# Patient Record
Sex: Female | Born: 1935 | Race: White | Hispanic: No | Smoking: Never smoker
Health system: Southern US, Community
[De-identification: ages and names within clinical notes are randomized; demographics above are authoritative.]

## PROBLEM LIST (undated history)

## (undated) DIAGNOSIS — I10 Essential (primary) hypertension: Secondary | ICD-10-CM

## (undated) DIAGNOSIS — M1990 Unspecified osteoarthritis, unspecified site: Secondary | ICD-10-CM

## (undated) DIAGNOSIS — E119 Type 2 diabetes mellitus without complications: Secondary | ICD-10-CM

## (undated) DIAGNOSIS — I6529 Occlusion and stenosis of unspecified carotid artery: Secondary | ICD-10-CM

## (undated) DIAGNOSIS — E785 Hyperlipidemia, unspecified: Secondary | ICD-10-CM

## (undated) DIAGNOSIS — H9190 Unspecified hearing loss, unspecified ear: Secondary | ICD-10-CM

## (undated) HISTORY — PX: OTHER SURGICAL HISTORY: SHX169

## (undated) HISTORY — PX: EYE SURGERY: SHX253

---

## 2011-12-15 DIAGNOSIS — E119 Type 2 diabetes mellitus without complications: Secondary | ICD-10-CM | POA: Diagnosis not present

## 2011-12-15 DIAGNOSIS — E7800 Pure hypercholesterolemia, unspecified: Secondary | ICD-10-CM | POA: Diagnosis not present

## 2011-12-15 DIAGNOSIS — I10 Essential (primary) hypertension: Secondary | ICD-10-CM | POA: Diagnosis not present

## 2012-02-22 DIAGNOSIS — R238 Other skin changes: Secondary | ICD-10-CM | POA: Diagnosis not present

## 2012-02-22 DIAGNOSIS — R209 Unspecified disturbances of skin sensation: Secondary | ICD-10-CM | POA: Diagnosis not present

## 2012-02-22 DIAGNOSIS — C44319 Basal cell carcinoma of skin of other parts of face: Secondary | ICD-10-CM | POA: Diagnosis not present

## 2012-02-24 DIAGNOSIS — Z1231 Encounter for screening mammogram for malignant neoplasm of breast: Secondary | ICD-10-CM | POA: Diagnosis not present

## 2012-03-27 DIAGNOSIS — I6529 Occlusion and stenosis of unspecified carotid artery: Secondary | ICD-10-CM | POA: Diagnosis not present

## 2012-03-27 DIAGNOSIS — Z48812 Encounter for surgical aftercare following surgery on the circulatory system: Secondary | ICD-10-CM | POA: Diagnosis not present

## 2012-03-30 DIAGNOSIS — M545 Low back pain: Secondary | ICD-10-CM | POA: Diagnosis not present

## 2012-03-30 DIAGNOSIS — M25559 Pain in unspecified hip: Secondary | ICD-10-CM | POA: Diagnosis not present

## 2012-04-18 DIAGNOSIS — C44201 Unspecified malignant neoplasm of skin of unspecified ear and external auricular canal: Secondary | ICD-10-CM | POA: Diagnosis not present

## 2012-04-18 DIAGNOSIS — C44319 Basal cell carcinoma of skin of other parts of face: Secondary | ICD-10-CM | POA: Diagnosis not present

## 2012-04-18 DIAGNOSIS — D485 Neoplasm of uncertain behavior of skin: Secondary | ICD-10-CM | POA: Diagnosis not present

## 2012-05-23 DIAGNOSIS — C44300 Unspecified malignant neoplasm of skin of unspecified part of face: Secondary | ICD-10-CM | POA: Diagnosis not present

## 2012-05-23 DIAGNOSIS — D485 Neoplasm of uncertain behavior of skin: Secondary | ICD-10-CM | POA: Diagnosis not present

## 2012-05-23 DIAGNOSIS — C44319 Basal cell carcinoma of skin of other parts of face: Secondary | ICD-10-CM | POA: Diagnosis not present

## 2012-05-23 DIAGNOSIS — L738 Other specified follicular disorders: Secondary | ICD-10-CM | POA: Diagnosis not present

## 2012-06-12 DIAGNOSIS — I10 Essential (primary) hypertension: Secondary | ICD-10-CM | POA: Diagnosis not present

## 2012-06-12 DIAGNOSIS — E119 Type 2 diabetes mellitus without complications: Secondary | ICD-10-CM | POA: Diagnosis not present

## 2012-06-12 DIAGNOSIS — E7800 Pure hypercholesterolemia, unspecified: Secondary | ICD-10-CM | POA: Diagnosis not present

## 2012-06-25 DIAGNOSIS — E119 Type 2 diabetes mellitus without complications: Secondary | ICD-10-CM | POA: Diagnosis not present

## 2012-08-24 DIAGNOSIS — C44599 Other specified malignant neoplasm of skin of other part of trunk: Secondary | ICD-10-CM | POA: Diagnosis not present

## 2012-09-05 DIAGNOSIS — Z23 Encounter for immunization: Secondary | ICD-10-CM | POA: Diagnosis not present

## 2012-09-13 DIAGNOSIS — C44601 Unspecified malignant neoplasm of skin of unspecified upper limb, including shoulder: Secondary | ICD-10-CM | POA: Diagnosis not present

## 2012-09-13 DIAGNOSIS — C44621 Squamous cell carcinoma of skin of unspecified upper limb, including shoulder: Secondary | ICD-10-CM | POA: Diagnosis not present

## 2012-11-08 DIAGNOSIS — C44201 Unspecified malignant neoplasm of skin of unspecified ear and external auricular canal: Secondary | ICD-10-CM | POA: Diagnosis not present

## 2012-12-13 DIAGNOSIS — I10 Essential (primary) hypertension: Secondary | ICD-10-CM | POA: Diagnosis not present

## 2012-12-13 DIAGNOSIS — E7800 Pure hypercholesterolemia, unspecified: Secondary | ICD-10-CM | POA: Diagnosis not present

## 2012-12-13 DIAGNOSIS — Z13820 Encounter for screening for osteoporosis: Secondary | ICD-10-CM | POA: Diagnosis not present

## 2012-12-13 DIAGNOSIS — E1159 Type 2 diabetes mellitus with other circulatory complications: Secondary | ICD-10-CM | POA: Diagnosis not present

## 2012-12-13 DIAGNOSIS — M2550 Pain in unspecified joint: Secondary | ICD-10-CM | POA: Diagnosis not present

## 2013-01-24 DIAGNOSIS — N951 Menopausal and female climacteric states: Secondary | ICD-10-CM | POA: Diagnosis not present

## 2013-05-27 DIAGNOSIS — D485 Neoplasm of uncertain behavior of skin: Secondary | ICD-10-CM | POA: Diagnosis not present

## 2013-06-12 DIAGNOSIS — E7800 Pure hypercholesterolemia, unspecified: Secondary | ICD-10-CM | POA: Diagnosis not present

## 2013-06-12 DIAGNOSIS — E119 Type 2 diabetes mellitus without complications: Secondary | ICD-10-CM | POA: Diagnosis not present

## 2013-06-12 DIAGNOSIS — I10 Essential (primary) hypertension: Secondary | ICD-10-CM | POA: Diagnosis not present

## 2013-06-27 DIAGNOSIS — L089 Local infection of the skin and subcutaneous tissue, unspecified: Secondary | ICD-10-CM | POA: Diagnosis not present

## 2013-06-27 DIAGNOSIS — E119 Type 2 diabetes mellitus without complications: Secondary | ICD-10-CM | POA: Diagnosis not present

## 2013-06-27 DIAGNOSIS — L578 Other skin changes due to chronic exposure to nonionizing radiation: Secondary | ICD-10-CM | POA: Diagnosis not present

## 2013-06-27 DIAGNOSIS — D485 Neoplasm of uncertain behavior of skin: Secondary | ICD-10-CM | POA: Diagnosis not present

## 2013-07-19 DIAGNOSIS — Z1331 Encounter for screening for depression: Secondary | ICD-10-CM | POA: Diagnosis not present

## 2013-07-19 DIAGNOSIS — M79609 Pain in unspecified limb: Secondary | ICD-10-CM | POA: Diagnosis not present

## 2013-08-20 DIAGNOSIS — M1610 Unilateral primary osteoarthritis, unspecified hip: Secondary | ICD-10-CM | POA: Diagnosis not present

## 2013-08-20 DIAGNOSIS — M5137 Other intervertebral disc degeneration, lumbosacral region: Secondary | ICD-10-CM | POA: Diagnosis not present

## 2013-09-10 DIAGNOSIS — Z23 Encounter for immunization: Secondary | ICD-10-CM | POA: Diagnosis not present

## 2013-10-07 DIAGNOSIS — B0052 Herpesviral keratitis: Secondary | ICD-10-CM | POA: Diagnosis not present

## 2013-10-16 DIAGNOSIS — B0052 Herpesviral keratitis: Secondary | ICD-10-CM | POA: Diagnosis not present

## 2013-10-25 DIAGNOSIS — B0052 Herpesviral keratitis: Secondary | ICD-10-CM | POA: Diagnosis not present

## 2013-11-15 DIAGNOSIS — B0052 Herpesviral keratitis: Secondary | ICD-10-CM | POA: Diagnosis not present

## 2013-12-09 DIAGNOSIS — B0052 Herpesviral keratitis: Secondary | ICD-10-CM | POA: Diagnosis not present

## 2013-12-13 DIAGNOSIS — I10 Essential (primary) hypertension: Secondary | ICD-10-CM | POA: Diagnosis not present

## 2013-12-13 DIAGNOSIS — M169 Osteoarthritis of hip, unspecified: Secondary | ICD-10-CM | POA: Diagnosis not present

## 2013-12-13 DIAGNOSIS — E119 Type 2 diabetes mellitus without complications: Secondary | ICD-10-CM | POA: Diagnosis not present

## 2013-12-13 DIAGNOSIS — E7800 Pure hypercholesterolemia, unspecified: Secondary | ICD-10-CM | POA: Diagnosis not present

## 2013-12-13 DIAGNOSIS — M1610 Unilateral primary osteoarthritis, unspecified hip: Secondary | ICD-10-CM | POA: Diagnosis not present

## 2014-02-10 DIAGNOSIS — B0052 Herpesviral keratitis: Secondary | ICD-10-CM | POA: Diagnosis not present

## 2014-02-14 DIAGNOSIS — I10 Essential (primary) hypertension: Secondary | ICD-10-CM | POA: Diagnosis not present

## 2014-04-03 DIAGNOSIS — M5137 Other intervertebral disc degeneration, lumbosacral region: Secondary | ICD-10-CM | POA: Diagnosis not present

## 2014-04-22 NOTE — Progress Notes (Signed)
[place], [place]  - Please enter preop orders in Epic for [employer] - surg date 6/2 and preop op is 5/28.  Thanks.

## 2014-04-23 ENCOUNTER — Encounter (HOSPITAL_COMMUNITY): Payer: Self-pay | Admitting: Pharmacy Technician

## 2014-04-29 DIAGNOSIS — M1610 Unilateral primary osteoarthritis, unspecified hip: Secondary | ICD-10-CM | POA: Diagnosis not present

## 2014-04-29 DIAGNOSIS — M169 Osteoarthritis of hip, unspecified: Secondary | ICD-10-CM | POA: Diagnosis not present

## 2014-04-29 DIAGNOSIS — Z01818 Encounter for other preprocedural examination: Secondary | ICD-10-CM | POA: Diagnosis not present

## 2014-04-30 ENCOUNTER — Encounter (HOSPITAL_COMMUNITY): Payer: Self-pay

## 2014-05-01 ENCOUNTER — Encounter (INDEPENDENT_AMBULATORY_CARE_PROVIDER_SITE_OTHER): Payer: Self-pay

## 2014-05-01 ENCOUNTER — Encounter (HOSPITAL_COMMUNITY): Payer: Self-pay

## 2014-05-01 ENCOUNTER — Ambulatory Visit (HOSPITAL_COMMUNITY)
Admission: RE | Admit: 2014-05-01 | Discharge: 2014-05-01 | Disposition: A | Discharge status: Home / Self Care | Payer: Medicare Other | Source: Ambulatory Visit | Attending: Anesthesiology | Admitting: Anesthesiology

## 2014-05-01 ENCOUNTER — Encounter (HOSPITAL_COMMUNITY)
Admission: RE | Admit: 2014-05-01 | Discharge: 2014-05-01 | Disposition: A | Discharge status: Home / Self Care | Payer: Medicare Other | Source: Ambulatory Visit | Attending: Orthopedic Surgery | Admitting: Orthopedic Surgery

## 2014-05-01 DIAGNOSIS — Z01812 Encounter for preprocedural laboratory examination: Secondary | ICD-10-CM | POA: Insufficient documentation

## 2014-05-01 DIAGNOSIS — I10 Essential (primary) hypertension: Secondary | ICD-10-CM | POA: Diagnosis not present

## 2014-05-01 DIAGNOSIS — Z01818 Encounter for other preprocedural examination: Secondary | ICD-10-CM | POA: Insufficient documentation

## 2014-05-01 DIAGNOSIS — Z01810 Encounter for preprocedural cardiovascular examination: Secondary | ICD-10-CM | POA: Insufficient documentation

## 2014-05-01 HISTORY — DX: Occlusion and stenosis of unspecified carotid artery: I65.29

## 2014-05-01 HISTORY — DX: Unspecified osteoarthritis, unspecified site: M19.90

## 2014-05-01 HISTORY — DX: Unspecified hearing loss, unspecified ear: H91.90

## 2014-05-01 HISTORY — DX: Type 2 diabetes mellitus without complications: E11.9

## 2014-05-01 HISTORY — DX: Essential (primary) hypertension: I10

## 2014-05-01 HISTORY — DX: Hyperlipidemia, unspecified: E78.5

## 2014-05-01 LAB — URINALYSIS, ROUTINE W REFLEX MICROSCOPIC
Bilirubin Urine: NEGATIVE
GLUCOSE, UA: NEGATIVE mg/dL
HGB URINE DIPSTICK: NEGATIVE
KETONES UR: NEGATIVE mg/dL
Nitrite: NEGATIVE
Protein, ur: NEGATIVE mg/dL
Specific Gravity, Urine: 1.017 (ref 1.005–1.030)
Urobilinogen, UA: 1 mg/dL (ref 0.0–1.0)
pH: 7.5 (ref 5.0–8.0)

## 2014-05-01 LAB — CBC
HCT: 38 % (ref 36.0–46.0)
Hemoglobin: 12.8 g/dL (ref 12.0–15.0)
MCH: 30.1 pg (ref 26.0–34.0)
MCHC: 33.7 g/dL (ref 30.0–36.0)
MCV: 89.4 fL (ref 78.0–100.0)
PLATELETS: 232 10*3/uL (ref 150–400)
RBC: 4.25 MIL/uL (ref 3.87–5.11)
RDW: 12.7 % (ref 11.5–15.5)
WBC: 7.2 10*3/uL (ref 4.0–10.5)

## 2014-05-01 LAB — SURGICAL PCR SCREEN
MRSA, PCR: INVALID — AB
Staphylococcus aureus: INVALID — AB

## 2014-05-01 LAB — ABO/RH: ABO/RH(D): O POS

## 2014-05-01 LAB — BASIC METABOLIC PANEL
BUN: 22 mg/dL (ref 6–23)
CO2: 27 mEq/L (ref 19–32)
Calcium: 10.6 mg/dL — ABNORMAL HIGH (ref 8.4–10.5)
Chloride: 94 mEq/L — ABNORMAL LOW (ref 96–112)
Creatinine, Ser: 0.93 mg/dL (ref 0.50–1.10)
GFR calc non Af Amer: 55 mL/min — ABNORMAL LOW (ref >90)
GFR, EST AFRICAN AMERICAN: 64 mL/min — AB (ref >90)
Glucose, Bld: 117 mg/dL — ABNORMAL HIGH (ref 70–99)
Potassium: 5 mEq/L (ref 3.7–5.3)
Sodium: 133 mEq/L — ABNORMAL LOW (ref 137–147)

## 2014-05-01 LAB — PROTIME-INR
INR: 0.99 (ref 0.00–1.49)
Prothrombin Time: 12.9 seconds (ref 11.6–15.2)

## 2014-05-01 LAB — URINE MICROSCOPIC-ADD ON

## 2014-05-01 LAB — APTT: APTT: 38 s — AB (ref 24–37)

## 2014-05-01 NOTE — H&P (Signed)
TOTAL HIP ADMISSION H&P  Patient is admitted for right total hip arthroplasty, anterior approach.  Subjective:  Chief Complaint:     Right hip OA / pain  HPI: Brandy Hawkins, 80 y.o. female, has a history of pain and functional disability in the right hip(s) due to arthritis and patient has failed non-surgical conservative treatments for greater than 12 weeks to include NSAID's and/or analgesics, use of assistive devices and activity modification.  Onset of symptoms was gradual starting 1+ years ago with gradually worsening course since that time.The patient noted no past surgery on the right hip(s).  Patient currently rates pain in the right hip at 9 out of 10 with activity. Patient has worsening of pain with activity and weight bearing, trendelenberg gait, pain that interfers with activities of daily living and pain with passive range of motion. Patient has evidence of periarticular osteophytes and joint space narrowing by imaging studies. This condition presents safety issues increasing the risk of falls.  There is no current active infection.  Risks, benefits and expectations were discussed with the patient.  Risks including but not limited to the risk of anesthesia, blood clots, nerve damage, blood vessel damage, failure of the prosthesis, infection and up to and including death.  Patient understand the risks, benefits and expectations and wishes to proceed with surgery.   PCP: Beatris Si  D/C Plans:      SNF - [facility] in [place]  Post-op Meds:       No Rx given  Tranexamic Acid:      Is not to be given - CAD  Decadron:      Is not to be given - DM  FYI:     ASA post-op  Norco post-op    Past Medical History  Diagnosis Date  . Diabetes mellitus without complication   . Hypertension   . Hyperlipidemia   . Carotid artery stenosis     SURGERY X 2 LEFT CAROTID--PT STATES NO DISEASE RT CAROTID AND HER LAST VISIT WITH VASCULAR DOCTOR - TOLD ALL OKAY  . Hearing impairment      SOMETIMES WEARS HEARING AIDS  . Arthritis     OA AND PAIN RIGHT HIP    Past Surgical History  Procedure Laterality Date  . Carotid surgery x 2 left side  04/03/02 AND 3/1 04  . Eye surgery      05/30/07 RT EYE  08/01/07  LEFT EYE  . Orif left wrist 09/07/94      No prescriptions prior to admission   No Known Allergies   History  Substance Use Topics  . Smoking status: Never Smoker   . Smokeless tobacco: Not on file  . Alcohol Use: No    No family history on file.   Review of Systems  Constitutional: Negative.   HENT: Positive for hearing loss.   Eyes: Negative.   Respiratory: Negative.   Cardiovascular: Negative.   Gastrointestinal: Negative.   Genitourinary: Negative.   Musculoskeletal: Positive for joint pain.  Skin: Negative.   Neurological: Negative.   Endo/Heme/Allergies: Negative.   Psychiatric/Behavioral: Negative.     Objective:  Physical Exam  Constitutional: She is oriented to person, place, and time. She appears well-developed and well-nourished.  HENT:  Head: Normocephalic and atraumatic.  Mouth/Throat: Oropharynx is clear and moist.  Eyes: Pupils are equal, round, and reactive to light.  Neck: Neck supple. No JVD present. No tracheal deviation present. No thyromegaly present.  Cardiovascular: Normal rate, regular rhythm, normal heart sounds and  intact distal pulses.   Respiratory: Effort normal and breath sounds normal. No stridor. No respiratory distress. She has no wheezes.  GI: Soft. There is no tenderness. There is no guarding.  Musculoskeletal:       Right hip: She exhibits decreased range of motion, decreased strength, tenderness and bony tenderness. She exhibits no swelling, no deformity and no laceration.  Lymphadenopathy:    She has no cervical adenopathy.  Neurological: She is alert and oriented to person, place, and time.  Skin: Skin is warm.  Psychiatric: She has a normal mood and affect.     Imaging Review Plain radiographs  demonstrate severe degenerative joint disease of the right hip(s). The bone quality appears to be good for age and reported activity level.  Assessment/Plan:  End stage arthritis, right hip(s)  The patient history, physical examination, clinical judgement of the provider and imaging studies are consistent with end stage degenerative joint disease of the right hip(s) and total hip arthroplasty is deemed medically necessary. The treatment options including medical management, injection therapy, arthroscopy and arthroplasty were discussed at length. The risks and benefits of total hip arthroplasty were presented and reviewed. The risks due to aseptic loosening, infection, stiffness, dislocation/subluxation,  thromboembolic complications and other imponderables were discussed.  The patient acknowledged the explanation, agreed to proceed with the plan and consent was signed. Patient is being admitted for inpatient treatment for surgery, pain control, PT, OT, prophylactic antibiotics, VTE prophylaxis, progressive ambulation and ADL's and discharge planning.The patient is planning to be discharged to skilled nursing facility.     West Pugh Dreshaun Stene   PA-C  05/01/2014, 2:22 PM

## 2014-05-01 NOTE — Pre-Procedure Instructions (Addendum)
EKG AND CXR WERE DONE TODAY - PREOP AT [facility]. MEDICAL CLEARANCE AND OFFICE NOTES 04/29/14 ON PT'S CHART FROM MARK HEPLER, PA WITH [facility].

## 2014-05-01 NOTE — Patient Instructions (Signed)
YOUR SURGERY IS SCHEDULED AT [facility]  ON:   Tuesday  6/2  REPORT TO  SHORT STAY CENTER AT:  5:00 AM   PLEASE COME IN THE [place] ENTRANCE AND FOLLOW SIGNS TO SHORT STAY CENTER.  DO NOT EAT OR DRINK ANYTHING AFTER MIDNIGHT THE NIGHT BEFORE YOUR SURGERY.  YOU MAY BRUSH YOUR TEETH, RINSE OUT YOUR MOUTH--BUT NO WATER, NO FOOD, NO CHEWING GUM, NO MINTS, NO CANDIES, NO CHEWING TOBACCO.  PLEASE TAKE THE FOLLOWING MEDICATIONS THE AM OF YOUR SURGERY WITH A FEW SIPS OF WATER:  NO MEDICATIONS TO TAKE.   IF YOU ARE DIABETIC:  DO NOT TAKE ANY DIABETIC MEDICATIONS THE AM OF YOUR SURGERY.    DO NOT BRING VALUABLES, [place], CREDIT CARDS.  DO NOT WEAR JEWELRY, MAKE-UP, NAIL POLISH AND NO METAL PINS OR CLIPS IN YOUR HAIR. CONTACT LENS, DENTURES / PARTIALS, GLASSES SHOULD NOT BE WORN TO SURGERY AND IN MOST CASES-HEARING AIDS WILL NEED TO BE REMOVED.  BRING YOUR GLASSES CASE, ANY EQUIPMENT NEEDED FOR YOUR CONTACT LENS. FOR PATIENTS ADMITTED TO THE HOSPITAL--CHECK OUT TIME THE DAY OF DISCHARGE IS 11:00 AM.  ALL INPATIENT ROOMS ARE PRIVATE - WITH BATHROOM, TELEPHONE, TELEVISION AND WIFI INTERNET.                                                     PLEASE READ OVER ANY  FACT SHEETS THAT YOU WERE GIVEN: MRSA INFORMATION, BLOOD TRANSFUSION INFORMATION, INCENTIVE SPIROMETER INFORMATION.  PLEASE BE AWARE THAT YOU MAY NEED ADDITIONAL BLOOD DRAWN DAY OF YOUR SURGERY  _______________________________________________________________________   [facility] - Preparing for Surgery Before surgery, you can play an important role.  Because skin is not sterile, your skin needs to be as free of germs as possible.  You can reduce the number of germs on your skin by washing with CHG (chlorahexidine gluconate) soap before surgery.  CHG is an antiseptic cleaner which kills germs and bonds with the skin to continue killing germs even after washing. Please DO NOT use if you have an allergy to CHG or  antibacterial soaps.  If your skin becomes reddened/irritated stop using the CHG and inform your nurse when you arrive at Short Stay. Do not shave (including legs and underarms) for at least 48 hours prior to the first CHG shower.  You may shave your face/neck. Please follow these instructions carefully:  1.  Shower with CHG Soap the night before surgery and the  morning of Surgery.  2.  If you choose to wash your hair, wash your hair first as usual with your  normal  shampoo.  3.  After you shampoo, rinse your hair and body thoroughly to remove the  shampoo.                           4.  Use CHG as you would any other liquid soap.  You can apply chg directly  to the skin and wash                       Gently with a scrungie or clean washcloth.  5.  Apply the CHG Soap to your body ONLY FROM THE NECK DOWN.   Do not use on face/ open  Wound or open sores. Avoid contact with eyes, ears mouth and genitals (private parts).                       Wash face,  Genitals (private parts) with your normal soap.             6.  Wash thoroughly, paying special attention to the area where your surgery  will be performed.  7.  Thoroughly rinse your body with warm water from the neck down.  8.  DO NOT shower/wash with your normal soap after using and rinsing off  the CHG Soap.                9.  Pat yourself dry with a clean towel.            10.  Wear clean pajamas.            11.  Place clean sheets on your bed the night of your first shower and do not  sleep with pets. Day of Surgery : Do not apply any lotions/deodorants the morning of surgery.  Please wear clean clothes to the hospital/surgery center.  FAILURE TO FOLLOW THESE INSTRUCTIONS MAY RESULT IN THE CANCELLATION OF YOUR SURGERY PATIENT SIGNATURE_________________________________  NURSE SIGNATURE__________________________________  ________________________________________________________________________   Brandy Hawkins  An incentive spirometer is a tool that can help keep your lungs clear and active. This tool measures how well you are filling your lungs with each breath. Taking long deep breaths may help reverse or decrease the chance of developing breathing (pulmonary) problems (especially infection) following:  A long period of time when you are unable to move or be active. BEFORE THE PROCEDURE   If the spirometer includes an indicator to show your best effort, your nurse or respiratory therapist will set it to a desired goal.  If possible, sit up straight or lean slightly forward. Try not to slouch.  Hold the incentive spirometer in an upright position. INSTRUCTIONS FOR USE  1. Sit on the edge of your bed if possible, or sit up as far as you can in bed or on a chair. 2. Hold the incentive spirometer in an upright position. 3. Breathe out normally. 4. Place the mouthpiece in your mouth and seal your lips tightly around it. 5. Breathe in slowly and as deeply as possible, raising the piston or the ball toward the top of the column. 6. Hold your breath for 3-5 seconds or for as long as possible. Allow the piston or ball to fall to the bottom of the column. 7. Remove the mouthpiece from your mouth and breathe out normally. 8. Rest for a few seconds and repeat Steps 1 through 7 at least 10 times every 1-2 hours when you are awake. Take your time and take a few normal breaths between deep breaths. 9. The spirometer may include an indicator to show your best effort. Use the indicator as a goal to work toward during each repetition. 10. After each set of 10 deep breaths, practice coughing to be sure your lungs are clear. If you have an incision (the cut made at the time of surgery), support your incision when coughing by placing a pillow or rolled up towels firmly against it. Once you are able to get out of bed, walk around indoors and cough well. You may stop using the incentive spirometer when  instructed by your caregiver.  RISKS AND COMPLICATIONS  Take your time so you do not  get dizzy or light-headed.  If you are in pain, you may need to take or ask for pain medication before doing incentive spirometry. It is harder to take a deep breath if you are having pain. AFTER USE  Rest and breathe slowly and easily.  It can be helpful to keep track of a log of your progress. Your caregiver can provide you with a simple table to help with this. If you are using the spirometer at home, follow these instructions: [place] IF:   You are having difficultly using the spirometer.  You have trouble using the spirometer as often as instructed.  Your pain medication is not giving enough relief while using the spirometer.  You develop fever of 100.5 F (38.1 C) or higher. SEEK IMMEDIATE MEDICAL CARE IF:   You cough up bloody sputum that had not been present before.  You develop fever of 102 F (38.9 C) or greater.  You develop worsening pain at or near the incision site. MAKE SURE YOU:   Understand these instructions.  Will watch your condition.  Will get help right away if you are not doing well or get worse. Document Released: 04/03/2007 Document Revised: 02/13/2012 Document Reviewed: 06/04/2007 ExitCare Patient Information 2014 ExitCare, [place].   ________________________________________________________________________  WHAT IS A BLOOD TRANSFUSION? Blood Transfusion Information  A transfusion is the replacement of blood or some of its parts. Blood is made up of multiple cells which provide different functions.  Red blood cells carry oxygen and are used for blood loss replacement.  White blood cells fight against infection.  Platelets control bleeding.  Plasma helps clot blood.  Other blood products are available for specialized needs, such as hemophilia or other clotting disorders. BEFORE THE TRANSFUSION  Who gives blood for transfusions?   Healthy  volunteers who are fully evaluated to make sure their blood is safe. This is blood bank blood. Transfusion therapy is the safest it has ever been in the practice of medicine. Before blood is taken from a donor, a complete history is taken to make sure that person has no history of diseases nor engages in risky social behavior (examples are intravenous drug use or sexual activity with multiple partners). The donor's travel history is screened to minimize risk of transmitting infections, such as malaria. The donated blood is tested for signs of infectious diseases, such as HIV and hepatitis. The blood is then tested to be sure it is compatible with you in order to minimize the chance of a transfusion reaction. If you or a relative donates blood, this is often done in anticipation of surgery and is not appropriate for emergency situations. It takes many days to process the donated blood. RISKS AND COMPLICATIONS Although transfusion therapy is very safe and saves many lives, the main dangers of transfusion include:   Getting an infectious disease.  Developing a transfusion reaction. This is an allergic reaction to something in the blood you were given. Every precaution is taken to prevent this. The decision to have a blood transfusion has been considered carefully by your caregiver before blood is given. Blood is not given unless the benefits outweigh the risks. AFTER THE TRANSFUSION  Right after receiving a blood transfusion, you will usually feel much better and more energetic. This is especially true if your red blood cells have gotten low (anemic). The transfusion raises the level of the red blood cells which carry oxygen, and this usually causes an energy increase.  The nurse administering the transfusion will  monitor you carefully for complications. HOME CARE INSTRUCTIONS  No special instructions are needed after a transfusion. You may find your energy is better. Speak with your caregiver about any  limitations on activity for underlying diseases you may have. SEEK MEDICAL CARE IF:   Your condition is not improving after your transfusion.  You develop redness or irritation at the intravenous (IV) site. SEEK IMMEDIATE MEDICAL CARE IF:  Any of the following symptoms occur over the next 12 hours:  Shaking chills.  You have a temperature by mouth above 102 F (38.9 C), not controlled by medicine.  Chest, back, or muscle pain.  People around you feel you are not acting correctly or are confused.  Shortness of breath or difficulty breathing.  Dizziness and fainting.  You get a rash or develop hives.  You have a decrease in urine output.  Your urine turns a dark color or changes to pink, red, or brown. Any of the following symptoms occur over the next 10 days:  You have a temperature by mouth above 102 F (38.9 C), not controlled by medicine.  Shortness of breath.  Weakness after normal activity.  The [place] part of the eye turns yellow (jaundice).  You have a decrease in the amount of urine or are urinating less often.  Your urine turns a dark color or changes to pink, red, or brown. Document Released: 11/18/2000 Document Revised: 02/13/2012 Document Reviewed: 07/07/2008 [facility] Patient Information 2014 [place], [place].  _______________________________________________________________________

## 2014-05-03 LAB — MRSA CULTURE

## 2014-05-06 ENCOUNTER — Encounter (HOSPITAL_COMMUNITY)
Admission: RE | Disposition: A | Discharge status: Skilled Nursing Facility | Payer: Self-pay | Source: Ambulatory Visit | Attending: Orthopedic Surgery

## 2014-05-06 ENCOUNTER — Encounter (HOSPITAL_COMMUNITY): Payer: Self-pay | Admitting: *Deleted

## 2014-05-06 ENCOUNTER — Inpatient Hospital Stay (HOSPITAL_COMMUNITY)
Admission: RE | Admit: 2014-05-06 | Discharge: 2014-05-09 | DRG: 470 | Disposition: A | Discharge status: Skilled Nursing Facility | Payer: Medicare Other | Source: Ambulatory Visit | Attending: Orthopedic Surgery | Admitting: Orthopedic Surgery

## 2014-05-06 ENCOUNTER — Inpatient Hospital Stay (HOSPITAL_COMMUNITY): Payer: Medicare Other

## 2014-05-06 ENCOUNTER — Encounter (HOSPITAL_COMMUNITY): Payer: Medicare Other | Admitting: Anesthesiology

## 2014-05-06 ENCOUNTER — Inpatient Hospital Stay (HOSPITAL_COMMUNITY): Payer: Medicare Other | Admitting: Anesthesiology

## 2014-05-06 DIAGNOSIS — E119 Type 2 diabetes mellitus without complications: Secondary | ICD-10-CM | POA: Diagnosis present

## 2014-05-06 DIAGNOSIS — Z96649 Presence of unspecified artificial hip joint: Secondary | ICD-10-CM | POA: Diagnosis not present

## 2014-05-06 DIAGNOSIS — Z7982 Long term (current) use of aspirin: Secondary | ICD-10-CM | POA: Diagnosis not present

## 2014-05-06 DIAGNOSIS — E785 Hyperlipidemia, unspecified: Secondary | ICD-10-CM | POA: Diagnosis present

## 2014-05-06 DIAGNOSIS — D62 Acute posthemorrhagic anemia: Secondary | ICD-10-CM | POA: Diagnosis not present

## 2014-05-06 DIAGNOSIS — M169 Osteoarthritis of hip, unspecified: Principal | ICD-10-CM | POA: Diagnosis present

## 2014-05-06 DIAGNOSIS — H9190 Unspecified hearing loss, unspecified ear: Secondary | ICD-10-CM | POA: Diagnosis present

## 2014-05-06 DIAGNOSIS — M25559 Pain in unspecified hip: Secondary | ICD-10-CM | POA: Diagnosis not present

## 2014-05-06 DIAGNOSIS — R29818 Other symptoms and signs involving the nervous system: Secondary | ICD-10-CM | POA: Diagnosis not present

## 2014-05-06 DIAGNOSIS — Z471 Aftercare following joint replacement surgery: Secondary | ICD-10-CM | POA: Diagnosis not present

## 2014-05-06 DIAGNOSIS — I10 Essential (primary) hypertension: Secondary | ICD-10-CM | POA: Diagnosis not present

## 2014-05-06 DIAGNOSIS — M1610 Unilateral primary osteoarthritis, unspecified hip: Principal | ICD-10-CM | POA: Diagnosis present

## 2014-05-06 DIAGNOSIS — R279 Unspecified lack of coordination: Secondary | ICD-10-CM | POA: Diagnosis not present

## 2014-05-06 DIAGNOSIS — R262 Difficulty in walking, not elsewhere classified: Secondary | ICD-10-CM | POA: Diagnosis not present

## 2014-05-06 DIAGNOSIS — D500 Iron deficiency anemia secondary to blood loss (chronic): Secondary | ICD-10-CM | POA: Diagnosis not present

## 2014-05-06 DIAGNOSIS — Z79899 Other long term (current) drug therapy: Secondary | ICD-10-CM

## 2014-05-06 HISTORY — PX: TOTAL HIP ARTHROPLASTY: SHX124

## 2014-05-06 LAB — GLUCOSE, CAPILLARY
GLUCOSE-CAPILLARY: 110 mg/dL — AB (ref 70–99)
GLUCOSE-CAPILLARY: 176 mg/dL — AB (ref 70–99)
GLUCOSE-CAPILLARY: 151 mg/dL — AB (ref 70–99)
Glucose-Capillary: 112 mg/dL — ABNORMAL HIGH (ref 70–99)

## 2014-05-06 LAB — TYPE AND SCREEN
ABO/RH(D): O POS
Antibody Screen: NEGATIVE

## 2014-05-06 SURGERY — ARTHROPLASTY, HIP, TOTAL, ANTERIOR APPROACH
Anesthesia: Spinal | Site: Hip | Laterality: Right

## 2014-05-06 MED ORDER — PROPOFOL 10 MG/ML IV BOLUS
INTRAVENOUS | Status: AC
  Filled 2014-05-06: qty 20

## 2014-05-06 MED ORDER — BISACODYL 10 MG RE SUPP: 10.0000 mg | Freq: Every day | RECTAL | Status: DC | PRN

## 2014-05-06 MED ORDER — PROPOFOL INFUSION 10 MG/ML OPTIME
INTRAVENOUS | Status: DC | PRN
  Administered 2014-05-06: 75 ug/kg/min via INTRAVENOUS

## 2014-05-06 MED ORDER — INSULIN ASPART 100 UNIT/ML ~~LOC~~ SOLN
0.0000 [IU] | Freq: Three times a day (TID) | SUBCUTANEOUS | Status: DC
  Administered 2014-05-06: 3 [IU] via SUBCUTANEOUS
  Administered 2014-05-07: 2 [IU] via SUBCUTANEOUS

## 2014-05-06 MED ORDER — HYDROMORPHONE HCL PF 1 MG/ML IJ SOLN: 0.5000 mg | INTRAMUSCULAR | Status: DC | PRN

## 2014-05-06 MED ORDER — POLYETHYLENE GLYCOL 3350 17 G PO PACK
17.0000 g | PACK | Freq: Two times a day (BID) | ORAL | Status: DC
  Administered 2014-05-06 – 2014-05-09 (×4): 17 g via ORAL

## 2014-05-06 MED ORDER — FENTANYL CITRATE 0.05 MG/ML IJ SOLN
INTRAMUSCULAR | Status: AC
  Filled 2014-05-06: qty 2

## 2014-05-06 MED ORDER — ONDANSETRON HCL 4 MG PO TABS
4.0000 mg | ORAL_TABLET | Freq: Four times a day (QID) | ORAL | Status: DC | PRN
  Administered 2014-05-07: 4 mg via ORAL
  Filled 2014-05-06: qty 1

## 2014-05-06 MED ORDER — HYDROCODONE-ACETAMINOPHEN 7.5-325 MG PO TABS
1.0000 | ORAL_TABLET | ORAL | Status: DC
  Administered 2014-05-06 – 2014-05-07 (×4): 1 via ORAL
  Administered 2014-05-07: 2 via ORAL
  Administered 2014-05-07 – 2014-05-08 (×2): 1 via ORAL
  Administered 2014-05-08: 2 via ORAL
  Administered 2014-05-08 – 2014-05-09 (×2): 1 via ORAL
  Filled 2014-05-06 (×3): qty 1
  Filled 2014-05-06: qty 2
  Filled 2014-05-06 (×2): qty 1
  Filled 2014-05-06 (×3): qty 2

## 2014-05-06 MED ORDER — SODIUM CHLORIDE 0.9 % IV SOLN
100.0000 mL/h | INTRAVENOUS | Status: DC
  Administered 2014-05-06 (×2): 100 mL/h via INTRAVENOUS
  Filled 2014-05-06 (×14): qty 1000

## 2014-05-06 MED ORDER — FENTANYL CITRATE 0.05 MG/ML IJ SOLN
INTRAMUSCULAR | Status: DC | PRN
  Administered 2014-05-06: 100 ug via INTRAVENOUS

## 2014-05-06 MED ORDER — MENTHOL 3 MG MT LOZG: 1.0000 | LOZENGE | OROMUCOSAL | Status: DC | PRN

## 2014-05-06 MED ORDER — HYDROCHLOROTHIAZIDE 25 MG PO TABS
25.0000 mg | ORAL_TABLET | Freq: Every day | ORAL | Status: DC
  Administered 2014-05-06 – 2014-05-09 (×4): 25 mg via ORAL
  Filled 2014-05-06 (×4): qty 1

## 2014-05-06 MED ORDER — METFORMIN HCL 500 MG PO TABS
500.0000 mg | ORAL_TABLET | Freq: Two times a day (BID) | ORAL | Status: DC
  Administered 2014-05-06 – 2014-05-09 (×6): 500 mg via ORAL
  Filled 2014-05-06 (×8): qty 1

## 2014-05-06 MED ORDER — DOCUSATE SODIUM 100 MG PO CAPS
100.0000 mg | ORAL_CAPSULE | Freq: Two times a day (BID) | ORAL | Status: DC
  Administered 2014-05-06 – 2014-05-09 (×7): 100 mg via ORAL

## 2014-05-06 MED ORDER — METOCLOPRAMIDE HCL 10 MG PO TABS: 5.0000 mg | ORAL_TABLET | Freq: Three times a day (TID) | ORAL | Status: DC | PRN

## 2014-05-06 MED ORDER — 0.9 % SODIUM CHLORIDE (POUR BTL) OPTIME
TOPICAL | Status: DC | PRN
  Administered 2014-05-06: 1000 mL

## 2014-05-06 MED ORDER — ASPIRIN EC 325 MG PO TBEC
325.0000 mg | DELAYED_RELEASE_TABLET | Freq: Two times a day (BID) | ORAL | Status: DC
  Administered 2014-05-07 – 2014-05-09 (×5): 325 mg via ORAL
  Filled 2014-05-06 (×7): qty 1

## 2014-05-06 MED ORDER — FENTANYL CITRATE 0.05 MG/ML IJ SOLN: 25.0000 ug | INTRAMUSCULAR | Status: DC | PRN

## 2014-05-06 MED ORDER — LACTATED RINGERS IV SOLN
INTRAVENOUS | Status: DC | PRN
  Administered 2014-05-06: 09:00:00 via INTRAVENOUS
  Administered 2014-05-06: 1000 mL
  Administered 2014-05-06: 07:00:00 via INTRAVENOUS

## 2014-05-06 MED ORDER — METHOCARBAMOL 1000 MG/10ML IJ SOLN
500.0000 mg | Freq: Four times a day (QID) | INTRAVENOUS | Status: DC | PRN
  Filled 2014-05-06: qty 5

## 2014-05-06 MED ORDER — FERROUS SULFATE 325 (65 FE) MG PO TABS
325.0000 mg | ORAL_TABLET | Freq: Three times a day (TID) | ORAL | Status: DC
  Administered 2014-05-06 – 2014-05-09 (×8): 325 mg via ORAL
  Filled 2014-05-06 (×12): qty 1

## 2014-05-06 MED ORDER — MEPERIDINE HCL 50 MG/ML IJ SOLN: 6.2500 mg | INTRAMUSCULAR | Status: DC | PRN

## 2014-05-06 MED ORDER — DIPHENHYDRAMINE HCL 25 MG PO CAPS: 25.0000 mg | ORAL_CAPSULE | Freq: Four times a day (QID) | ORAL | Status: DC | PRN

## 2014-05-06 MED ORDER — ATORVASTATIN CALCIUM 40 MG PO TABS
40.0000 mg | ORAL_TABLET | Freq: Every day | ORAL | Status: DC
  Administered 2014-05-06 – 2014-05-08 (×3): 40 mg via ORAL
  Filled 2014-05-06 (×4): qty 1

## 2014-05-06 MED ORDER — CEFAZOLIN SODIUM-DEXTROSE 2-3 GM-% IV SOLR
2.0000 g | INTRAVENOUS | Status: AC
  Administered 2014-05-06: 2 g via INTRAVENOUS

## 2014-05-06 MED ORDER — BUPIVACAINE HCL (PF) 0.5 % IJ SOLN
INTRAMUSCULAR | Status: AC
  Filled 2014-05-06: qty 30

## 2014-05-06 MED ORDER — PHENYLEPHRINE HCL 10 MG/ML IJ SOLN
INTRAMUSCULAR | Status: DC | PRN
Start: 2014-05-06 — End: 2014-05-06
  Administered 2014-05-06 (×2): 80 ug via INTRAVENOUS

## 2014-05-06 MED ORDER — EZETIMIBE 10 MG PO TABS
10.0000 mg | ORAL_TABLET | Freq: Every day | ORAL | Status: DC
  Administered 2014-05-06 – 2014-05-08 (×3): 10 mg via ORAL
  Filled 2014-05-06 (×4): qty 1

## 2014-05-06 MED ORDER — ONDANSETRON HCL 4 MG/2ML IJ SOLN: 4.0000 mg | Freq: Four times a day (QID) | INTRAMUSCULAR | Status: DC | PRN

## 2014-05-06 MED ORDER — IRBESARTAN 300 MG PO TABS
300.0000 mg | ORAL_TABLET | Freq: Every day | ORAL | Status: DC
  Administered 2014-05-06 – 2014-05-09 (×4): 300 mg via ORAL
  Filled 2014-05-06 (×4): qty 1

## 2014-05-06 MED ORDER — ACETAMINOPHEN 10 MG/ML IV SOLN
1000.0000 mg | Freq: Once | INTRAVENOUS | Status: AC
  Administered 2014-05-06: 1000 mg via INTRAVENOUS
  Filled 2014-05-06: qty 100

## 2014-05-06 MED ORDER — MIDAZOLAM HCL 5 MG/5ML IJ SOLN
INTRAMUSCULAR | Status: DC | PRN
  Administered 2014-05-06: 0.5 mg via INTRAVENOUS

## 2014-05-06 MED ORDER — CHLORHEXIDINE GLUCONATE 4 % EX LIQD: 60.0000 mL | Freq: Once | CUTANEOUS | Status: DC

## 2014-05-06 MED ORDER — GLYCOPYRROLATE 0.2 MG/ML IJ SOLN
INTRAMUSCULAR | Status: AC
  Filled 2014-05-06: qty 1

## 2014-05-06 MED ORDER — PROPOFOL 10 MG/ML IV BOLUS
INTRAVENOUS | Status: DC | PRN
  Administered 2014-05-06: 50 mg via INTRAVENOUS

## 2014-05-06 MED ORDER — TRANEXAMIC ACID 100 MG/ML IV SOLN
1000.0000 mg | INTRAVENOUS | Status: AC
  Administered 2014-05-06: 1000 mg via INTRAVENOUS
  Filled 2014-05-06: qty 10

## 2014-05-06 MED ORDER — PHENYLEPHRINE HCL 10 MG/ML IJ SOLN
10.0000 mg | INTRAVENOUS | Status: DC | PRN
  Administered 2014-05-06: 10 ug/min via INTRAVENOUS

## 2014-05-06 MED ORDER — MIDAZOLAM HCL 2 MG/2ML IJ SOLN
INTRAMUSCULAR | Status: AC
  Filled 2014-05-06: qty 2

## 2014-05-06 MED ORDER — GLYCOPYRROLATE 0.2 MG/ML IJ SOLN
0.2000 mg | Freq: Once | INTRAMUSCULAR | Status: AC
  Administered 2014-05-06: 0.2 mg via INTRAVENOUS

## 2014-05-06 MED ORDER — METOCLOPRAMIDE HCL 5 MG/ML IJ SOLN: 5.0000 mg | Freq: Three times a day (TID) | INTRAMUSCULAR | Status: DC | PRN

## 2014-05-06 MED ORDER — CELECOXIB 200 MG PO CAPS
200.0000 mg | ORAL_CAPSULE | Freq: Two times a day (BID) | ORAL | Status: DC
  Administered 2014-05-06 – 2014-05-09 (×7): 200 mg via ORAL
  Filled 2014-05-06 (×8): qty 1

## 2014-05-06 MED ORDER — BUPIVACAINE HCL (PF) 0.5 % IJ SOLN
INTRAMUSCULAR | Status: DC | PRN
  Administered 2014-05-06: 3 mL

## 2014-05-06 MED ORDER — PROMETHAZINE HCL 25 MG/ML IJ SOLN: 6.2500 mg | INTRAMUSCULAR | Status: DC | PRN

## 2014-05-06 MED ORDER — OLMESARTAN MEDOXOMIL-HCTZ 40-25 MG PO TABS: 1.0000 | ORAL_TABLET | Freq: Every morning | ORAL | Status: DC

## 2014-05-06 MED ORDER — STERILE WATER FOR IRRIGATION IR SOLN
Status: DC | PRN
  Administered 2014-05-06: 1500 mL

## 2014-05-06 MED ORDER — CEFAZOLIN SODIUM-DEXTROSE 2-3 GM-% IV SOLR
2.0000 g | Freq: Four times a day (QID) | INTRAVENOUS | Status: AC
  Administered 2014-05-06 (×2): 2 g via INTRAVENOUS
  Filled 2014-05-06 (×2): qty 50

## 2014-05-06 MED ORDER — METHOCARBAMOL 500 MG PO TABS: 500.0000 mg | ORAL_TABLET | Freq: Four times a day (QID) | ORAL | Status: DC | PRN

## 2014-05-06 MED ORDER — CEFAZOLIN SODIUM-DEXTROSE 2-3 GM-% IV SOLR
INTRAVENOUS | Status: AC
  Filled 2014-05-06: qty 50

## 2014-05-06 MED ORDER — ALUM & MAG HYDROXIDE-SIMETH 200-200-20 MG/5ML PO SUSP
30.0000 mL | ORAL | Status: DC | PRN
  Administered 2014-05-09: 30 mL via ORAL
  Filled 2014-05-06: qty 30

## 2014-05-06 MED ORDER — DEXAMETHASONE SODIUM PHOSPHATE 10 MG/ML IJ SOLN
INTRAMUSCULAR | Status: DC | PRN
Start: 2014-05-06 — End: 2014-05-06
  Administered 2014-05-06: 10 mg via INTRAVENOUS

## 2014-05-06 MED ORDER — PHENOL 1.4 % MT LIQD: 1.0000 | OROMUCOSAL | Status: DC | PRN

## 2014-05-06 MED ORDER — MAGNESIUM CITRATE PO SOLN: 1.0000 | Freq: Once | ORAL | Status: AC | PRN

## 2014-05-06 MED ORDER — DEXAMETHASONE SODIUM PHOSPHATE 10 MG/ML IJ SOLN
INTRAMUSCULAR | Status: AC
  Filled 2014-05-06: qty 1

## 2014-05-06 SURGICAL SUPPLY — 33 items
BAG ZIPLOCK 12X15 (MISCELLANEOUS) IMPLANT
CAPT HIP PF MOP ×3 IMPLANT
COVER PERINEAL POST (MISCELLANEOUS) ×3 IMPLANT
DERMABOND ADVANCED (GAUZE/BANDAGES/DRESSINGS) ×2
DERMABOND ADVANCED .7 DNX12 (GAUZE/BANDAGES/DRESSINGS) ×1 IMPLANT
DRAPE C-ARM 42X120 X-RAY (DRAPES) ×3 IMPLANT
DRAPE STERI IOBAN 125X83 (DRAPES) ×3 IMPLANT
DRAPE U-SHAPE 47X51 STRL (DRAPES) ×9 IMPLANT
DRSG AQUACEL AG ADV 3.5X10 (GAUZE/BANDAGES/DRESSINGS) ×3 IMPLANT
DURAPREP 26ML APPLICATOR (WOUND CARE) ×3 IMPLANT
ELECT BLADE TIP CTD 4 INCH (ELECTRODE) ×3 IMPLANT
ELECT PENCIL ROCKER SW 15FT (MISCELLANEOUS) ×3 IMPLANT
ELECT REM PT RETURN 15FT ADLT (MISCELLANEOUS) ×3 IMPLANT
FACESHIELD WRAPAROUND (MASK) ×12 IMPLANT
GLOVE BIOGEL PI IND STRL 7.5 (GLOVE) ×1 IMPLANT
GLOVE BIOGEL PI IND STRL 8 (GLOVE) ×1 IMPLANT
GLOVE BIOGEL PI INDICATOR 7.5 (GLOVE) ×2
GLOVE BIOGEL PI INDICATOR 8 (GLOVE) ×2
GLOVE ECLIPSE 8.0 STRL XLNG CF (GLOVE) ×3 IMPLANT
GLOVE ORTHO TXT STRL SZ7.5 (GLOVE) ×6 IMPLANT
GOWN SPEC L3 XXLG W/TWL (GOWN DISPOSABLE) ×3 IMPLANT
GOWN STRL REUS W/TWL LRG LVL3 (GOWN DISPOSABLE) ×3 IMPLANT
KIT BASIN OR (CUSTOM PROCEDURE TRAY) ×3 IMPLANT
PACK TOTAL JOINT (CUSTOM PROCEDURE TRAY) ×3 IMPLANT
SAW OSC TIP CART 19.5X105X1.3 (SAW) ×3 IMPLANT
SUT MNCRL AB 4-0 PS2 18 (SUTURE) ×3 IMPLANT
SUT VIC AB 1 CT1 36 (SUTURE) ×9 IMPLANT
SUT VIC AB 2-0 CT1 27 (SUTURE) ×4
SUT VIC AB 2-0 CT1 TAPERPNT 27 (SUTURE) ×2 IMPLANT
SUT VLOC 180 0 24IN GS25 (SUTURE) ×3 IMPLANT
TOWEL OR 17X26 10 PK STRL BLUE (TOWEL DISPOSABLE) ×3 IMPLANT
TOWEL OR NON WOVEN STRL DISP B (DISPOSABLE) IMPLANT
TRAY FOLEY CATH 14FRSI W/METER (CATHETERS) ×3 IMPLANT

## 2014-05-06 NOTE — Interval H&P Note (Signed)
History and Physical Interval Note:  05/06/2014 7:10 AM  Brandy Hawkins  has presented today for surgery, with the diagnosis of right hip osteoarthritis  The various methods of treatment have been discussed with the patient and family. After consideration of risks, benefits and other options for treatment, the patient has consented to  Procedure(s): RIGHT TOTAL HIP ARTHROPLASTY ANTERIOR APPROACH (Right) as a surgical intervention .  The patient's history has been reviewed, patient examined, no change in status, stable for surgery.  I have reviewed the patient's chart and labs.  Questions were answered to the patient's satisfaction.     [name]

## 2014-05-06 NOTE — Progress Notes (Signed)
Utilization review completed.  

## 2014-05-06 NOTE — Progress Notes (Signed)
Pt's heart rate 30's-40's; Dr. Marcell Barlow notified, order rec'd and med given

## 2014-05-06 NOTE — Anesthesia Preprocedure Evaluation (Addendum)
Anesthesia Evaluation  Patient identified by MRN, date of birth, ID band Patient awake    Reviewed: Allergy & Precautions, H&P , NPO status , Patient's Chart, lab work & pertinent test results  Airway Mallampati: II TM Distance: >3 FB Neck ROM: Full    Dental no notable dental hx.    Pulmonary neg pulmonary ROS,  breath sounds clear to auscultation  Pulmonary exam normal       Cardiovascular hypertension, Pt. on medications Rhythm:Regular Rate:Normal     Neuro/Psych negative neurological ROS  negative psych ROS   GI/Hepatic negative GI ROS, Neg liver ROS,   Endo/Other  diabetes, Type 2, Oral Hypoglycemic Agents  Renal/GU negative Renal ROS  negative genitourinary   Musculoskeletal negative musculoskeletal ROS (+)   Abdominal   Peds negative pediatric ROS (+)  Hematology negative hematology ROS (+)   Anesthesia Other Findings   Reproductive/Obstetrics negative OB ROS                          Anesthesia Physical Anesthesia Plan  ASA: III  Anesthesia Plan: Spinal   Post-op Pain Management:    Induction:   Airway Management Planned: Simple Face Mask  Additional Equipment:   Intra-op Plan:   Post-operative Plan:   Informed Consent: I have reviewed the patients History and Physical, chart, labs and discussed the procedure including the risks, benefits and alternatives for the proposed anesthesia with the patient or authorized representative who has indicated his/her understanding and acceptance.   Dental advisory given  Plan Discussed with: CRNA  Anesthesia Plan Comments:        Anesthesia Quick Evaluation

## 2014-05-06 NOTE — Evaluation (Signed)
Physical Therapy Evaluation Patient Details Name: Brandy Hawkins MRN: [id number] DOB: 21-Apr-1929 Today's Date: 05/06/2014   History of Present Illness  R DA-THA  Clinical Impression  *Pt is s/p THA resulting in the deficits listed below (see PT Problem List).** Pt will benefit from skilled PT to increase their independence and safety with mobility to allow discharge to the venue listed below.    **    Follow Up Recommendations SNF    Equipment Recommendations  None recommended by PT    Recommendations for Other Services       Precautions / Restrictions Precautions Precautions: None Restrictions Weight Bearing Restrictions: No Other Position/Activity Restrictions: WBAT      Mobility  Bed Mobility Overal bed mobility: Needs Assistance Bed Mobility: Supine to Sit     Supine to sit: Mod assist     General bed mobility comments: assist to raise trunk  Transfers Overall transfer level: Needs assistance Equipment used: Rolling walker (2 wheeled) Transfers: Sit to/from Stand Sit to Stand: Min assist         General transfer comment: cues for hand placement, assist to power up  Ambulation/Gait   Ambulation Distance (Feet): 45 Feet Assistive device: Rolling walker (2 wheeled) Gait Pattern/deviations: Step-to pattern   Gait velocity interpretation: Below normal speed for age/gender General Gait Details: cues for positioning in RW (pt steps too far into frame of RW)  Stairs            Wheelchair Mobility    Modified Rankin (Stroke Patients Only)       Balance Overall balance assessment: Needs assistance   Sitting balance-Leahy Scale: Good       Standing balance-Leahy Scale: Poor                               Pertinent Vitals/Pain *5/10 R hip Pain medicine requested Ice applied**    Home Living                        Prior Function                 Hand Dominance        Extremity/Trunk Assessment   Upper  Extremity Assessment: Overall WFL for tasks assessed           Lower Extremity Assessment: RLE deficits/detail RLE Deficits / Details: AAROM R hip WFL, knee ext 3/5, ankle WNL    Cervical / Trunk Assessment: Normal  Communication      Cognition Arousal/Alertness: Awake/alert Behavior During Therapy: WFL for tasks assessed/performed Overall Cognitive Status: Within Functional Limits for tasks assessed                      General Comments      Exercises Total Joint Exercises Ankle Circles/Pumps: AROM;Both;10 reps Heel Slides: AAROM;Right;10 reps;Supine Hip ABduction/ADduction: AAROM;Right;10 reps;Supine      Assessment/Plan    PT Assessment Patient needs continued PT services  PT Diagnosis Difficulty walking;Acute pain   PT Problem List Decreased strength;Decreased activity tolerance;Decreased balance;Pain;Decreased knowledge of use of DME;Decreased mobility;Decreased safety awareness  PT Treatment Interventions DME instruction;Gait training;Functional mobility training;Therapeutic activities;Patient/family education;Therapeutic exercise   PT Goals (Current goals can be found in the Care Plan section) Acute Rehab PT Goals Patient Stated Goal: to walk PT Goal Formulation: With patient Time For Goal Achievement: 05/20/14 Potential to Achieve Goals: Good    Frequency 7X/week  Barriers to discharge        Co-evaluation               End of Session Equipment Utilized During Treatment: Gait belt Activity Tolerance: Patient tolerated treatment well Patient left: in chair;with call bell/phone within reach;with family/visitor present Nurse Communication: Mobility status         Time: 1610-9604 PT Time Calculation (min): 22 min   Charges:   PT Evaluation $Initial PT Evaluation Tier I: 1 Procedure PT Treatments $Gait Training: 8-22 mins   PT G CodesLucile Crater 05/06/2014, 4:09 PM [phone]

## 2014-05-06 NOTE — Plan of Care (Signed)
Problem: Consults Goal: Diagnosis- Total Joint Replacement Right anterior hip     

## 2014-05-06 NOTE — Anesthesia Procedure Notes (Signed)
Spinal  Patient location during procedure: OR End time: 05/06/2014 7:30 AM Staffing CRNA/Resident: Diedra Sinor E Preanesthetic Checklist Completed: patient identified, site marked, surgical consent, pre-op evaluation, timeout performed, IV checked, risks and benefits discussed and monitors and equipment checked Spinal Block Patient position: sitting Prep: Betadine Patient monitoring: continuous pulse ox and blood pressure Approach: right paramedian Location: L2-3 Injection technique: single-shot Needle Needle type: Spinocan  Needle gauge: 22 G Assessment Sensory level: T4 Additional Notes Pt tolerated procedure well. Spinal kit date checked and within date.Good CSF flow without paresthesia or heme.

## 2014-05-06 NOTE — Anesthesia Postprocedure Evaluation (Signed)
  Anesthesia Post-op Note  Patient: Brandy Hawkins  Procedure(s) Performed: Procedure(s) (LRB): RIGHT TOTAL HIP ARTHROPLASTY ANTERIOR APPROACH (Right)  Patient Location: PACU  Anesthesia Type: Spinal  Level of Consciousness: awake and alert   Airway and Oxygen Therapy: Patient Spontanous Breathing  Post-op Pain: mild  Post-op Assessment: Post-op Vital signs reviewed, Patient's Cardiovascular Status Stable, Respiratory Function Stable, Patent Airway and No signs of Nausea or vomiting  Last Vitals:  Filed Vitals:   05/06/14 1247  BP: 187/76  Pulse: 76  Temp: 36.5 C  Resp: 18    Post-op Vital Signs: stable   Complications: No apparent anesthesia complications

## 2014-05-06 NOTE — Transfer of Care (Signed)
Immediate Anesthesia Transfer of Care Note  Patient: Brandy Hawkins  Procedure(s) Performed: Procedure(s): RIGHT TOTAL HIP ARTHROPLASTY ANTERIOR APPROACH (Right)  Patient Location: PACU  Anesthesia Type:Spinal  Level of Consciousness: awake, alert , oriented and patient cooperative   Airway & Oxygen Therapy: Patient Spontanous Breathing and Patient connected to face mask oxygen  Post-op Assessment: Report given to PACU RN and Post -op Vital signs reviewed and stable  Post vital signs: stable  Complications: No apparent anesthesia complications  L1 spinal level

## 2014-05-06 NOTE — Op Note (Signed)
NAME:  Brandy Hawkins                ACCOUNT NO.: [id number]      MEDICAL RECORD NO.: [id number]      FACILITY:  [facility]      PHYSICIAN:  [name]  DATE OF BIRTH:  1929/11/25     DATE OF PROCEDURE:  05/06/2014                                 OPERATIVE REPORT         PREOPERATIVE DIAGNOSIS: Right  hip osteoarthritis.      POSTOPERATIVE DIAGNOSIS:  Right hip osteoarthritis.      PROCEDURE:  Right total hip replacement through an anterior approach   utilizing DePuy THR system, component size 55mm pinnacle cup, a size 32+4 neutral   Altrex liner, a size 4 Hi Tri Lock stem with a 32+1 Articuleze metal head ball.      SURGEON:  Pietro Cassis. Alvan Dame, M.D.      ASSISTANT:  Danae Orleans, PA-C      ANESTHESIA:  Spinal.      SPECIMENS:  None.      COMPLICATIONS:  None.      BLOOD LOSS:  500 cc     DRAINS:  None      INDICATION OF THE PROCEDURE:  Brandy Hawkins is a 80 y.o. female who had   presented to office for evaluation of right hip pain.  Radiographs revealed   progressive degenerative changes with bone-on-bone   articulation to the  hip joint.  The patient had painful limited range of   motion significantly affecting their overall quality of life.  The patient was failing to    respond to conservative measures, and at this point was ready   to proceed with more definitive measures.  The patient has noted progressive   degenerative changes in his hip, progressive problems and dysfunction   with regarding the hip prior to surgery.  Consent was obtained for   benefit of pain relief.  Specific risk of infection, DVT, component   failure, dislocation, need for revision surgery, as well discussion of   the anterior versus posterior approach were reviewed.  Consent was   obtained for benefit of anterior pain relief through an anterior   approach.      PROCEDURE IN DETAIL:  The patient was brought to operative theater.   Once adequate anesthesia, preoperative  antibiotics, 2gm Ancef administered.   The patient was positioned supine on the OSI Hanna table.  Once adequate   padding of boney process was carried out, we had predraped out the hip, and  used fluoroscopy to confirm orientation of the pelvis and position.      The right hip was then prepped and draped from proximal iliac crest to   mid thigh with shower curtain technique.      Time-out was performed identifying the patient, planned procedure, and   extremity.     An incision was then made 2 cm distal and lateral to the   anterior superior iliac spine extending over the orientation of the   tensor fascia lata muscle and sharp dissection was carried down to the   fascia of the muscle and protractor placed in the soft tissues.      The fascia was then incised.  The muscle belly was identified and swept  laterally and retractor placed along the superior neck.  Following   cauterization of the circumflex vessels and removing some pericapsular   fat, a second cobra retractor was placed on the inferior neck.  A third   retractor was placed on the anterior acetabulum after elevating the   anterior rectus.  A L-capsulotomy was along the line of the   superior neck to the trochanteric fossa, then extended proximally and   distally.  Tag sutures were placed and the retractors were then placed   intracapsular.  We then identified the trochanteric fossa and   orientation of my neck cut, confirmed this radiographically   and then made a neck osteotomy with the femur on traction.  The femoral   head was removed without difficulty or complication.  Traction was let   off and retractors were placed posterior and anterior around the   acetabulum.      The labrum and foveal tissue were debrided.  I began reaming with a 42mm   reamer and reamed up to 74mm reamer with good bony bed preparation and a 2mm   cup was chosen.  The final 22mm Pinnacle cup was then impacted under fluoroscopy  to confirm  the depth of penetration and orientation with respect to   abduction.  A screw was placed followed by the hole eliminator.  The final   32+4 neutral Altrex liner was impacted with good visualized rim fit.  The cup was positioned anatomically within the acetabular portion of the pelvis.      At this point, the femur was rolled at 80 degrees.  Further capsule was   released off the inferior aspect of the femoral neck.  I then   released the superior capsule proximally.  The hook was placed laterally   along the femur and elevated manually and held in position with the bed   hook.  The leg was then extended and adducted with the leg rolled to 100   degrees of external rotation.  Once the proximal femur was fully   exposed, I used a box osteotome to set orientation.  I then began   broaching with the starting chili pepper broach and passed this by hand and then broached up to 4.  With the 4 broach in place I chose a high offset neck and did trial reductions.  The offset was appropriate, leg lengths   appeared to be equal, importantly, as compared to her pre-operative radiographs indicating a longer RIGHT leg to begin with, confirmed radiographically.   Given these findings, I went ahead and dislocated the hip, repositioned all   retractors and positioned the right hip in the extended and abducted position.  The final 4 Hi Tri Lock stem was   chosen and it was impacted down to the level of neck cut.  Based on this   and the trial reduction, a 32+1 Articuleze metal ball was chosen and   impacted onto a clean and dry trunnion, and the hip was reduced.  The   hip had been irrigated throughout the case again at this point.  I did   reapproximate the superior capsular leaflet to the anterior leaflet   using #1 Vicryl.  The fascia of the   tensor fascia lata muscle was then reapproximated using #1 Vicryl and #0 V-lock sutures.  The   remaining wound was closed with 2-0 Vicryl and running 4-0 Monocryl.    The hip was cleaned, dried, and dressed sterilely using Dermabond and  Aquacel dressing.  She was then brought   to recovery room in stable condition tolerating the procedure well.    Danae Orleans, PA-C was present for the entirety of the case involved from   preoperative positioning, perioperative retractor management, general   facilitation of the case, as well as primary wound closure as assistant.            Pietro Cassis Alvan Dame, M.D.        05/06/2014 8:49 AM

## 2014-05-07 DIAGNOSIS — D500 Iron deficiency anemia secondary to blood loss (chronic): Secondary | ICD-10-CM | POA: Diagnosis not present

## 2014-05-07 LAB — BASIC METABOLIC PANEL
BUN: 21 mg/dL (ref 6–23)
CO2: 24 mEq/L (ref 19–32)
Calcium: 9.1 mg/dL (ref 8.4–10.5)
Chloride: 97 mEq/L (ref 96–112)
Creatinine, Ser: 0.7 mg/dL (ref 0.50–1.10)
GFR, EST NON AFRICAN AMERICAN: 78 mL/min — AB (ref >90)
Glucose, Bld: 110 mg/dL — ABNORMAL HIGH (ref 70–99)
POTASSIUM: 4.8 meq/L (ref 3.7–5.3)
Sodium: 131 mEq/L — ABNORMAL LOW (ref 137–147)

## 2014-05-07 LAB — GLUCOSE, CAPILLARY
GLUCOSE-CAPILLARY: 110 mg/dL — AB (ref 70–99)
GLUCOSE-CAPILLARY: 130 mg/dL — AB (ref 70–99)
Glucose-Capillary: 246 mg/dL — ABNORMAL HIGH (ref 70–99)
Glucose-Capillary: 129 mg/dL — ABNORMAL HIGH (ref 70–99)
Glucose-Capillary: 102 mg/dL — ABNORMAL HIGH (ref 70–99)

## 2014-05-07 LAB — CBC
HEMATOCRIT: 29 % — AB (ref 36.0–46.0)
Hemoglobin: 9.9 g/dL — ABNORMAL LOW (ref 12.0–15.0)
MCH: 30 pg (ref 26.0–34.0)
MCHC: 34.1 g/dL (ref 30.0–36.0)
MCV: 87.9 fL (ref 78.0–100.0)
PLATELETS: 225 10*3/uL (ref 150–400)
RBC: 3.3 MIL/uL — ABNORMAL LOW (ref 3.87–5.11)
RDW: 12.7 % (ref 11.5–15.5)
WBC: 12.6 10*3/uL — ABNORMAL HIGH (ref 4.0–10.5)

## 2014-05-07 NOTE — Progress Notes (Signed)
05/07/14 1300  PT Visit Information  Last PT Received On 05/07/14  Reason Eval/Treat Not Completed Other (comment) (declined due to nausea)

## 2014-05-07 NOTE — Clinical Social Work Psychosocial (Signed)
Clinical Social Work Department BRIEF PSYCHOSOCIAL ASSESSMENT 05/07/2014  Patient:  [facility]     Account Number:  [id number]     Admit date:  05/06/2014  Clinical Social Worker:  Daiva Huge  Date/Time:  05/07/2014 01:31 PM  Referred by:  Physician  Date Referred:  05/07/2014 Referred for  SNF Placement   Other Referral:   Interview type:  Patient Other interview type:    PSYCHOSOCIAL DATA Living Status:  FAMILY Admitted from facility:   Level of care:   Primary support name:  daughter- Primary support relationship to patient:  FAMILY Degree of support available:   good    CURRENT CONCERNS Current Concerns  Post-Acute Placement   Other Concerns:    SOCIAL WORK ASSESSMENT / PLAN Met with patient who reports she is interested in SNF for rehab- she lives with her daughter who works during the day- she has just completed some therapy and reports being sore and sleepy from not sleeping well last night-  she is interested in [place] which is close to her home.   Assessment/plan status:  Other - See comment Other assessment/ plan:   Information/referral to community resources:   SNF list    PATIENT'S/FAMILY'S RESPONSE TO PLAN OF CARE: Patient appreciaitive of CSW visit and assistance- she is doing well with PT and is hoepful she will not need to be at [facility] for long. CSW will pursue SNF options and advise as bed is secured.   Eduard Clos, MSW, [place]

## 2014-05-07 NOTE — Progress Notes (Signed)
OT Cancellation Note  Patient Details Name: Brandy Hawkins MRN: [id number] DOB: [birth year]   Cancelled Treatment:    Reason Eval/Treat Not Completed: Other (comment) Pt is Medicare/Medicaid and current D/C plan is SNF. No apparent immediate acute care OT needs, therefore will defer OT to SNF. If OT eval is needed please call Acute Rehab Dept. at [place] 05/07/2014, 12:56 PM Lesle Chris, OTR/L [phone] 05/07/2014

## 2014-05-07 NOTE — Clinical Social Work Placement (Signed)
Clinical Social Work Department CLINICAL SOCIAL WORK PLACEMENT NOTE 05/07/2014  Patient:  [facility]  Account Number:  [id number] Admit date:  05/06/2014  Clinical Social Worker:  Daiva Huge  Date/time:  05/07/2014 02:02 PM  Clinical Social Work is seeking post-discharge placement for this patient at the following level of care:   SKILLED NURSING   (*CSW will update this form in Epic as items are completed)   05/07/2014  Patient/family provided with [place] Department of Clinical Social Work's list of facilities offering this level of care within the geographic area requested by the patient (or if unable, by the patient's family).  05/07/2014  Patient/family informed of their freedom to choose among providers that offer the needed level of care, that participate in Medicare, Medicaid or managed care program needed by the patient, have an available bed and are willing to accept the patient.  05/07/2014  Patient/family informed of [facility]' ownership interest in [facility], as well as of the fact that they are under no obligation to receive care at this facility.  PASARR submitted to EDS on 05/07/2014 PASARR number received from EDS on 05/07/2014  FL2 transmitted to all facilities in geographic area requested by pt/family on  05/07/2014 FL2 transmitted to all facilities within larger geographic area on   Patient informed that his/her managed care company has contracts with or will negotiate with  certain facilities, including the following:     Patient/family informed of bed offers received:   Patient chooses bed at  Physician recommends and patient chooses bed at    Patient to be transferred to  on   Patient to be transferred to facility by   The following physician request were entered in Epic:   Additional Comments:   Eduard Clos, MSW, [place]

## 2014-05-07 NOTE — Care Management Note (Signed)
    Page 1 of 1   05/07/2014     11:07:49 AM CARE MANAGEMENT NOTE 05/07/2014  Patient:  [facility]   Account Number:  [id number]  Date Initiated:  05/07/2014  Documentation initiated by:  Sunday Spillers  Subjective/Objective Assessment:   80 yo female admitted s/p Right total hip replacement through an anterior approach. PTA lived at home with daughter.     Action/Plan:   SNF for rehab   Anticipated DC Date:  05/10/2014   Anticipated DC Plan:  SKILLED NURSING FACILITY  In-house referral  Clinical Social Worker      DC Planning Services  CM consult      Choice offered to / List presented to:             Status of service:  Completed, signed off Medicare Important Message given?   (If response is "NO", the following Medicare IM given date fields will be blank) Date Medicare IM given:   Date Additional Medicare IM given:    Discharge Disposition:  [place]  Per UR Regulation:  Reviewed for med. necessity/level of care/duration of stay  If discussed at [place] of Stay Meetings, dates discussed:    Comments:

## 2014-05-07 NOTE — Progress Notes (Addendum)
   Subjective: 1 Day Post-Op Procedure(s) (LRB): RIGHT TOTAL HIP ARTHROPLASTY ANTERIOR APPROACH (Right)   Patient reports pain as mild, pain controlled. No events throughout the night. Denies any CP or SOB. Eventual plan would be to discharged to SNF, [facility].  Objective:   VITALS:   Filed Vitals:   05/07/14  BP: 110/65  Pulse: 75  Temp: 97.2 F (36.2 C)  Resp: 16    Neurovascular intact Dorsiflexion/Plantar flexion intact Incision: dressing C/D/I No cellulitis present Compartment soft  LABS  Recent Labs  05/07/14 0456  HGB 9.9*  HCT 29.0*  WBC 12.6*  PLT 225     Recent Labs  05/07/14 0456  NA 131*  K 4.8  BUN 21  CREATININE 0.70  GLUCOSE 110*     Assessment/Plan: 1 Day Post-Op Procedure(s) (LRB): RIGHT TOTAL HIP ARTHROPLASTY ANTERIOR APPROACH (Right) Foley cath d/c'ed Advance diet Up with therapy D/C IV fluids Discharge to SNF [facility]) eventually, when ready  Expected ABLA  Treated with iron and will observe       West Pugh. Khristian Phillippi   PAC  05/07/2014, 9:59 AM

## 2014-05-07 NOTE — Progress Notes (Signed)
Physical Therapy Treatment Patient Details Name: Brandy Hawkins MRN: [id number] DOB: 15-May-1929 Today's Date: 05-14-14    History of Present Illness R DA-THA    PT Comments    Pt progressing; will benefit from SNF  To allow return to I  Follow Up Recommendations  SNF     Equipment Recommendations  None recommended by PT    Recommendations for Other Services       Precautions / Restrictions Precautions Precautions: None Restrictions Other Position/Activity Restrictions: WBAT    Mobility  Bed Mobility Overal bed mobility: Needs Assistance Bed Mobility: Supine to Sit     Supine to sit: Min guard     General bed mobility comments: for safety, initiate movement  Transfers Overall transfer level: Needs assistance Equipment used: Rolling walker (2 wheeled) Transfers: Sit to/from Stand Sit to Stand: Min assist;Min guard         General transfer comment: cues for hand placement and overall safety awareness  Ambulation/Gait Ambulation/Gait assistance: Min assist;Min guard Ambulation Distance (Feet): 90 Feet (x 2) Assistive device: Rolling walker (2 wheeled) Gait Pattern/deviations: Step-through pattern;Step-to pattern;Antalgic;Decreased stride length     General Gait Details: cues for positioning in RW (pt steps too far into frame of RW)   Stairs            Wheelchair Mobility    Modified Rankin (Stroke Patients Only)       Balance                                    Cognition Arousal/Alertness: Awake/alert Behavior During Therapy: WFL for tasks assessed/performed Overall Cognitive Status: Within Functional Limits for tasks assessed                      Exercises Total Joint Exercises Ankle Circles/Pumps: AROM;Both;10 reps Quad Sets: AROM;Both;10 reps Heel Slides: AAROM;Right;10 reps Hip ABduction/ADduction: AAROM;Right;10 reps    General Comments        Pertinent Vitals/Pain Minimal c/o pain    Home Living                      Prior Function            PT Goals (current goals can now be found in the care plan section) Acute Rehab PT Goals Patient Stated Goal: to walk PT Goal Formulation: With patient Time For Goal Achievement: 05/20/14 Potential to Achieve Goals: Good Progress towards PT goals: Progressing toward goals    Frequency  7X/week    PT Plan Current plan remains appropriate    Co-evaluation             End of Session Equipment Utilized During Treatment: Gait belt Activity Tolerance: Patient tolerated treatment well Patient left: in chair;with call bell/phone within reach;with family/visitor present     Time: 1145-1208 PT Time Calculation (min): 23 min  Charges:  $Gait Training: 8-22 mins $Therapeutic Exercise: 8-22 mins                    G Codes:      Neil Crouch 2014-05-14, 12:13 PM

## 2014-05-08 LAB — CBC
HCT: 26.8 % — ABNORMAL LOW (ref 36.0–46.0)
Hemoglobin: 9 g/dL — ABNORMAL LOW (ref 12.0–15.0)
MCH: 29.9 pg (ref 26.0–34.0)
MCHC: 33.6 g/dL (ref 30.0–36.0)
MCV: 89 fL (ref 78.0–100.0)
Platelets: 196 10*3/uL (ref 150–400)
RBC: 3.01 MIL/uL — ABNORMAL LOW (ref 3.87–5.11)
RDW: 13 % (ref 11.5–15.5)
WBC: 10.3 10*3/uL (ref 4.0–10.5)

## 2014-05-08 LAB — GLUCOSE, CAPILLARY
GLUCOSE-CAPILLARY: 101 mg/dL — AB (ref 70–99)
Glucose-Capillary: 105 mg/dL — ABNORMAL HIGH (ref 70–99)
Glucose-Capillary: 121 mg/dL — ABNORMAL HIGH (ref 70–99)
Glucose-Capillary: 171 mg/dL — ABNORMAL HIGH (ref 70–99)

## 2014-05-08 LAB — BASIC METABOLIC PANEL
BUN: 22 mg/dL (ref 6–23)
CALCIUM: 9 mg/dL (ref 8.4–10.5)
CO2: 25 mEq/L (ref 19–32)
CREATININE: 0.97 mg/dL (ref 0.50–1.10)
Chloride: 95 mEq/L — ABNORMAL LOW (ref 96–112)
GFR calc Af Amer: 61 mL/min — ABNORMAL LOW (ref >90)
GFR, EST NON AFRICAN AMERICAN: 53 mL/min — AB (ref >90)
GLUCOSE: 108 mg/dL — AB (ref 70–99)
Potassium: 4.9 mEq/L (ref 3.7–5.3)
SODIUM: 129 meq/L — AB (ref 137–147)

## 2014-05-08 MED ORDER — HYDROCODONE-ACETAMINOPHEN 7.5-325 MG PO TABS: 1.0000 | ORAL_TABLET | ORAL | Status: DC | PRN

## 2014-05-08 MED ORDER — FERROUS SULFATE 325 (65 FE) MG PO TABS: 325.0000 mg | ORAL_TABLET | Freq: Three times a day (TID) | ORAL | Status: DC

## 2014-05-08 MED ORDER — POLYETHYLENE GLYCOL 3350 17 G PO PACK: 17.0000 g | PACK | Freq: Two times a day (BID) | ORAL | Status: DC

## 2014-05-08 MED ORDER — TIZANIDINE HCL 4 MG PO TABS: 4.0000 mg | ORAL_TABLET | Freq: Four times a day (QID) | ORAL | Status: DC | PRN

## 2014-05-08 MED ORDER — ASPIRIN 325 MG PO TBEC: 325.0000 mg | DELAYED_RELEASE_TABLET | Freq: Two times a day (BID) | ORAL | Status: AC

## 2014-05-08 MED ORDER — DSS 100 MG PO CAPS: 100.0000 mg | ORAL_CAPSULE | Freq: Two times a day (BID) | ORAL | Status: DC

## 2014-05-08 NOTE — Clinical Social Work Note (Signed)
SNF bed confirmed at patient's preference at [facility] for tomorrow- advised PA and SNF and will tentatively plan for tomorrow.  Eduard Clos, MSW, [place]

## 2014-05-08 NOTE — Discharge Summary (Signed)
Physician Discharge Summary  Patient ID: [name] MRN: [id number] DOB/AGE: 78-May-1930 78 y.o.  Admit date: 05/06/2014 Discharge date:  05/09/2014   Procedures:  Procedure(s) (LRB): RIGHT TOTAL HIP ARTHROPLASTY ANTERIOR APPROACH (Right)  Attending Physician:  Dr. Paralee Cancel   Admission Diagnoses:   Right hip OA / pain  Discharge Diagnoses:  Principal Problem:   S/P right THA, AA Active Problems:   Expected blood loss anemia  Past Medical History  Diagnosis Date  . Diabetes mellitus without complication   . Hypertension   . Hyperlipidemia   . Carotid artery stenosis     SURGERY X 2 LEFT CAROTID--PT STATES NO DISEASE RT CAROTID AND HER LAST VISIT WITH VASCULAR DOCTOR - TOLD ALL OKAY  . Hearing impairment     SOMETIMES WEARS HEARING AIDS  . Arthritis     OA AND PAIN RIGHT HIP    HPI: Brandy Hawkins, 80 y.o. female, has a history of pain and functional disability in the right hip(s) due to arthritis and patient has failed non-surgical conservative treatments for greater than 12 weeks to include NSAID's and/or analgesics, use of assistive devices and activity modification. Onset of symptoms was gradual starting 1+ years ago with gradually worsening course since that time.The patient noted no past surgery on the right hip(s). Patient currently rates pain in the right hip at 9 out of 10 with activity. Patient has worsening of pain with activity and weight bearing, trendelenberg gait, pain that interfers with activities of daily living and pain with passive range of motion. Patient has evidence of periarticular osteophytes and joint space narrowing by imaging studies. This condition presents safety issues increasing the risk of falls. There is no current active infection. Risks, benefits and expectations were discussed with the patient. Risks including but not limited to the risk of anesthesia, blood clots, nerve damage, blood vessel damage, failure of the prosthesis, infection and up to and  including death. Patient understand the risks, benefits and expectations and wishes to proceed with surgery.    PCP: Beatris Si   Discharged Condition: good  Hospital Course:  Patient underwent the above stated procedure on 05/06/2014. Patient tolerated the procedure well and brought to the recovery room in good condition and subsequently to the floor.  POD #1 BP: 110/65 ; Pulse: 75 ; Temp: 97.2 F (36.2 C) ; Resp: 16 Patient reports pain as mild, pain controlled. No events throughout the night. Denies any CP or SOB. Eventual plan would be to discharged to SNF, [facility]. Neurovascular intact, dorsiflexion/plantar flexion intact, incision: dressing C/D/I, no cellulitis present and compartment soft.   LABS  Basename    HGB  9.9  HCT  29.0   POD #2  BP: 147/65 ; Pulse: 79 ; Temp: 97.8 F (36.6 C) ; Resp: 16 Patient reports pain as mild, pain controlled. No events throughout the night. Progressing with PT. Still plans on SNF at discharge. Neurovascular intact, dorsiflexion/plantar flexion intact, incision: dressing C/D/I, no cellulitis present and compartment soft.   LABS  Basename    HGB  9.0  HCT  26.8   POD #3  BP: 113/64 ; Pulse: 67 ; Temp: 97.7 F (36.5 C) ; Resp: 16  Patient reports pain as mild, pain controlled. No events throughout the night. Ready to be discharged to skilled nursing facility. Neurovascular intact, dorsiflexion/plantar flexion intact, incision: dressing C/D/I, no cellulitis present and compartment soft.   LABS   No new labs  Discharge Exam: General appearance: alert, cooperative and no  distress Extremities: Homans sign is negative, no sign of DVT, no edema, redness or tenderness in the calves or thighs and no ulcers, gangrene or trophic changes  Disposition:     Skilled nursing facility with follow up in 2 weeks   Follow-up Information   Follow up with [name], MD. Schedule an appointment as soon as possible for a visit in 2  weeks.   Specialty:  Orthopedic Surgery   Contact information:   [address] [place] [address] [phone]       Discharge Instructions   Call MD / Call 911    Complete by:  As directed   If you experience chest pain or shortness of breath, CALL 911 and be transported to the hospital emergency room.  If you develope a fever above 101 F, pus (white drainage) or increased drainage or redness at the wound, or calf pain, call your surgeon's office.     Change dressing    Complete by:  As directed   Maintain surgical dressing for 10-14 days, or until follow up in the clinic.     Constipation Prevention    Complete by:  As directed   Drink plenty of fluids.  Prune juice may be helpful.  You may use a stool softener, such as Colace (over the counter) 100 mg twice a day.  Use MiraLax (over the counter) for constipation as needed.     Diet - low sodium heart healthy    Complete by:  As directed      Discharge instructions    Complete by:  As directed   Maintain surgical dressing for 10-14 days, or until follow up in the clinic. Follow up in 2 weeks at [facility]. Call with any questions or concerns.     Driving restrictions    Complete by:  As directed   No driving for 4 weeks     Increase activity slowly as tolerated    Complete by:  As directed      TED hose    Complete by:  As directed   Use stockings (TED hose) for 2 weeks on both leg(s).  You may remove them at night for sleeping.     Weight bearing as tolerated    Complete by:  As directed              Medication List    STOP taking these medications       ibuprofen 200 MG tablet  Commonly known as:  ADVIL,MOTRIN      TAKE these medications       aspirin 325 MG EC tablet  Take 1 tablet (325 mg total) by mouth 2 (two) times daily.     atorvastatin 40 MG tablet  Commonly known as:  LIPITOR  Take 40 mg by mouth at bedtime.     DSS 100 MG Caps  Take 100 mg by mouth 2 (two) times  daily.     ezetimibe 10 MG tablet  Commonly known as:  ZETIA  Take 10 mg by mouth at bedtime.     ferrous sulfate 325 (65 FE) MG tablet  Take 1 tablet (325 mg total) by mouth 3 (three) times daily after meals.     HYDROcodone-acetaminophen 7.5-325 MG per tablet  Commonly known as:  NORCO  Take 1-2 tablets by mouth every 4 (four) hours as needed for moderate pain.     metFORMIN 500 MG tablet  Commonly known as:  GLUCOPHAGE  Take  500 mg by mouth 2 (two) times daily with a meal.     olmesartan-hydrochlorothiazide 40-25 MG per tablet  Commonly known as:  BENICAR HCT  Take 1 tablet by mouth every morning.     polyethylene glycol packet  Commonly known as:  MIRALAX / GLYCOLAX  Take 17 g by mouth 2 (two) times daily.     tiZANidine 4 MG tablet  Commonly known as:  ZANAFLEX  Take 1 tablet (4 mg total) by mouth every 6 (six) hours as needed for muscle spasms.         Signed: West Pugh. Nabeel Gladson   PA-C  05/08/2014, 8:51 AM

## 2014-05-08 NOTE — Progress Notes (Signed)
Physical Therapy Treatment Patient Details Name: Brandy Hawkins MRN: [id number] DOB: 28-Apr-1929 Today's Date: 2014/06/06    History of Present Illness R DA-THA    PT Comments    Progressing well; will benefit from SNF level therapies to allow return to [facility]   Follow Up Recommendations  SNF     Equipment Recommendations  None recommended by PT    Recommendations for Other Services       Precautions / Restrictions Precautions Precautions: None Restrictions Other Position/Activity Restrictions: WBAT    Mobility  Bed Mobility Overal bed mobility: Needs Assistance Bed Mobility: Supine to Sit     Supine to sit: Supervision     General bed mobility comments: for safety, initiate movement  Transfers Overall transfer level: Needs assistance Equipment used: Rolling walker (2 wheeled) Transfers: Sit to/from Stand Sit to Stand: Min guard;Supervision         General transfer comment: cues for hand placement and overall safety awareness  Ambulation/Gait Ambulation/Gait assistance: Supervision;Min guard Ambulation Distance (Feet): 120 Feet Assistive device: Rolling walker (2 wheeled) Gait Pattern/deviations: Step-to pattern;Step-through pattern Gait velocity: decr   General Gait Details: cues for positioning in RW (pt steps too far into frame of RW)   Stairs            Wheelchair Mobility    Modified Rankin (Stroke Patients Only)       Balance             Standing balance-Leahy Scale: Fair                      Cognition Arousal/Alertness: Awake/alert Behavior During Therapy: WFL for tasks assessed/performed Overall Cognitive Status: Within Functional Limits for tasks assessed                      Exercises Total Joint Exercises Hip ABduction/ADduction: AROM;Strengthening;Right;Standing;10 reps Knee Flexion: AROM;10 reps;Standing;Strengthening Marching in Standing: AROM;Strengthening;Right;10 reps;Standing Standing Hip Extension:  AROM;Strengthening;Right;10 reps;Standing    General Comments        Pertinent Vitals/Pain Pain controlled; "feels better than yesterday"    Home Living                      Prior Function            PT Goals (current goals can now be found in the care plan section) Acute Rehab PT Goals Patient Stated Goal: to walk PT Goal Formulation: With patient Time For Goal Achievement: 05/20/14 Potential to Achieve Goals: Good Progress towards PT goals: Progressing toward goals    Frequency  7X/week    PT Plan Current plan remains appropriate    Co-evaluation             End of Session   Activity Tolerance: Patient tolerated treatment well Patient left: with call bell/phone within reach;in bed     Time: 1305-1330 PT Time Calculation (min): 25 min  Charges:  $Gait Training: 8-22 mins $Therapeutic Exercise: 8-22 mins                    G Codes:      Neil Crouch 06-06-2014, 2:02 PM

## 2014-05-08 NOTE — Progress Notes (Signed)
   Subjective: 2 Days Post-Op Procedure(s) (LRB): RIGHT TOTAL HIP ARTHROPLASTY ANTERIOR APPROACH (Right)   Seen by Dr. Alvan Dame Patient reports pain as mild, pain controlled. No events throughout the night. Progressing with PT. Still plans on SNF at discharge.  Objective:   VITALS:   Filed Vitals:   05/08/14 0535  BP: 147/65  Pulse: 79  Temp: 97.8 F (36.6 C)  Resp: 16    Neurovascular intact Dorsiflexion/Plantar flexion intact Incision: dressing C/D/I No cellulitis present Compartment soft  LABS  Recent Labs  05/07/14 0456 05/08/14 0447  HGB 9.9* 9.0*  HCT 29.0* 26.8*  WBC 12.6* 10.3  PLT 225 196     Recent Labs  05/07/14 0456 05/08/14 0447  NA 131* 129*  K 4.8 4.9  BUN 21 22  CREATININE 0.70 0.97  GLUCOSE 110* 108*     Assessment/Plan: 2 Days Post-Op Procedure(s) (LRB): RIGHT TOTAL HIP ARTHROPLASTY ANTERIOR APPROACH (Right) Up with therapy Discharge to SNF [facility]) eventually, when ready  Expected ABLA  Treated with iron and will observe      [place]. Crystelle Ferrufino   PAC  05/08/2014, 7:42 AM

## 2014-05-09 DIAGNOSIS — I10 Essential (primary) hypertension: Secondary | ICD-10-CM | POA: Diagnosis not present

## 2014-05-09 DIAGNOSIS — E119 Type 2 diabetes mellitus without complications: Secondary | ICD-10-CM | POA: Diagnosis not present

## 2014-05-09 DIAGNOSIS — R279 Unspecified lack of coordination: Secondary | ICD-10-CM | POA: Diagnosis not present

## 2014-05-09 DIAGNOSIS — Z79899 Other long term (current) drug therapy: Secondary | ICD-10-CM | POA: Diagnosis not present

## 2014-05-09 DIAGNOSIS — Z96649 Presence of unspecified artificial hip joint: Secondary | ICD-10-CM | POA: Diagnosis not present

## 2014-05-09 DIAGNOSIS — R29818 Other symptoms and signs involving the nervous system: Secondary | ICD-10-CM | POA: Diagnosis not present

## 2014-05-09 DIAGNOSIS — E785 Hyperlipidemia, unspecified: Secondary | ICD-10-CM | POA: Diagnosis not present

## 2014-05-09 DIAGNOSIS — H9190 Unspecified hearing loss, unspecified ear: Secondary | ICD-10-CM | POA: Diagnosis not present

## 2014-05-09 DIAGNOSIS — Z471 Aftercare following joint replacement surgery: Secondary | ICD-10-CM | POA: Diagnosis not present

## 2014-05-09 DIAGNOSIS — D500 Iron deficiency anemia secondary to blood loss (chronic): Secondary | ICD-10-CM | POA: Diagnosis not present

## 2014-05-09 DIAGNOSIS — R262 Difficulty in walking, not elsewhere classified: Secondary | ICD-10-CM | POA: Diagnosis not present

## 2014-05-09 LAB — GLUCOSE, CAPILLARY
GLUCOSE-CAPILLARY: 118 mg/dL — AB (ref 70–99)
Glucose-Capillary: 122 mg/dL — ABNORMAL HIGH (ref 70–99)

## 2014-05-09 NOTE — Clinical Social Work Placement (Signed)
Clinical Social Work Department CLINICAL SOCIAL WORK PLACEMENT NOTE 05/09/2014  Patient:  [facility]  Account Number:  [id number] Admit date:  05/06/2014  Clinical Social Worker:  Daiva Huge  Date/time:  05/07/2014 02:02 PM  Clinical Social Work is seeking post-discharge placement for this patient at the following level of care:   SKILLED NURSING   (*CSW will update this form in Epic as items are completed)   05/07/2014  Patient/family provided with [place] Department of Clinical Social Work's list of facilities offering this level of care within the geographic area requested by the patient (or if unable, by the patient's family).  05/07/2014  Patient/family informed of their freedom to choose among providers that offer the needed level of care, that participate in Medicare, Medicaid or managed care program needed by the patient, have an available bed and are willing to accept the patient.  05/07/2014  Patient/family informed of [facility]' ownership interest in [facility], as well as of the fact that they are under no obligation to receive care at this facility.  PASARR submitted to EDS on 05/07/2014 PASARR number received from EDS on 05/07/2014  FL2 transmitted to all facilities in geographic area requested by pt/family on  05/07/2014 FL2 transmitted to all facilities within larger geographic area on   Patient informed that his/her managed care company has contracts with or will negotiate with  certain facilities, including the following:     Patient/family informed of bed offers received:  05/08/2014 Patient chooses bed at [place], [facility] Physician recommends and patient chooses bed at    Patient to be transferred to [place], [facility] on  05/09/2014 Patient to be transferred to facility by [place]  The following physician request were entered in Epic:   Additional Comments: Eduard Clos, MSW, [place]

## 2014-05-09 NOTE — Progress Notes (Signed)
Physical Therapy Treatment Patient Details Name: Rhett Najera MRN: [id number] DOB: [birth year] Today's Date: 05/09/2014    History of Present Illness R DA-THA    PT Comments    POD # 3 am session.  Assisted pt OOB with increased time to amb in hallway then positioned in recliner chair with ICE to R hip.  Pt plans to D/C to [place] today for [employer].    Follow Up Recommendations  SNF ([place])     Equipment Recommendations       Recommendations for Other Services       Precautions / Restrictions Precautions Precautions: None Restrictions Weight Bearing Restrictions: No Other Position/Activity Restrictions: WBAT    Mobility  Bed Mobility Overal bed mobility: Needs Assistance Bed Mobility: Supine to Sit     Supine to sit: Supervision;Min guard     General bed mobility comments: increased time  Transfers Overall transfer level: Needs assistance Equipment used: Rolling walker (2 wheeled) Transfers: Sit to/from Stand Sit to Stand: Min guard;Supervision         General transfer comment: increased time  Ambulation/Gait Ambulation/Gait assistance: Supervision;Min guard Ambulation Distance (Feet): 120 Feet Assistive device: Rolling walker (2 wheeled) Gait Pattern/deviations: Step-to pattern;Step-through pattern Gait velocity: decr   General Gait Details: one VC safety with turns using RW   Stairs            Wheelchair Mobility    Modified Rankin (Stroke Patients Only)       Balance                                    Cognition                            Exercises      General Comments        Pertinent Vitals/Pain C/o 3/10 "not bad" ICE applied    Home Living                      Prior Function            PT Goals (current goals can now be found in the care plan section) Progress towards PT goals: Progressing toward goals    Frequency  7X/week    PT Plan       Co-evaluation             End of Session Equipment Utilized During Treatment: Gait belt Activity Tolerance: Patient tolerated treatment well Patient left: with call bell/phone within reach;in chair     Time: 7209-4709 PT Time Calculation (min): 26 min  Charges:  $Gait Training: 8-22 mins $Therapeutic Activity: 8-22 mins                    G Codes:      Rica Koyanagi  PTA [facility]  Acute  Rehab Pager      [phone]

## 2014-05-09 NOTE — Clinical Social Work Note (Signed)
Patient for d/c today to SNF bed at [facility]- patient is pleased with her progress and eager to continue at  SNF- Daughter to transport.  Eduard Clos, MSW, [place]

## 2014-05-09 NOTE — Progress Notes (Signed)
   Subjective: 3 Days Post-Op Procedure(s) (LRB): RIGHT TOTAL HIP ARTHROPLASTY ANTERIOR APPROACH (Right)   Patient reports pain as mild, pain controlled. No events throughout the night. Ready to be discharged to skilled nursing facility.  Objective:   VITALS:   Filed Vitals:   05/09/14 0516  BP: 113/64  Pulse: 67  Temp: 97.7 F (36.5 C)  Resp: 16    Neurovascular intact Dorsiflexion/Plantar flexion intact Incision: dressing C/D/I No cellulitis present Compartment soft  LABS  Recent Labs  05/07/14 0456 05/08/14 0447  HGB 9.9* 9.0*  HCT 29.0* 26.8*  WBC 12.6* 10.3  PLT 225 196     Recent Labs  05/07/14 0456 05/08/14 0447  NA 131* 129*  K 4.8 4.9  BUN 21 22  CREATININE 0.70 0.97  GLUCOSE 110* 108*     Assessment/Plan: 3 Days Post-Op Procedure(s) (LRB): RIGHT TOTAL HIP ARTHROPLASTY ANTERIOR APPROACH (Right) Up with therapy Discharge to SNF Follow up in 2 weeks at [facility]. Follow up with OLIN,Callan Yontz D in 2 weeks.  Contact information:  [facility] [address] [place] Netanya Yazdani   PAC  05/09/2014, 7:56 AM

## 2014-06-05 DIAGNOSIS — M6281 Muscle weakness (generalized): Secondary | ICD-10-CM | POA: Diagnosis not present

## 2014-06-05 DIAGNOSIS — M167 Other unilateral secondary osteoarthritis of hip: Secondary | ICD-10-CM | POA: Diagnosis not present

## 2014-06-10 DIAGNOSIS — M167 Other unilateral secondary osteoarthritis of hip: Secondary | ICD-10-CM | POA: Diagnosis not present

## 2014-06-10 DIAGNOSIS — M6281 Muscle weakness (generalized): Secondary | ICD-10-CM | POA: Diagnosis not present

## 2014-06-12 DIAGNOSIS — M6281 Muscle weakness (generalized): Secondary | ICD-10-CM | POA: Diagnosis not present

## 2014-06-12 DIAGNOSIS — M167 Other unilateral secondary osteoarthritis of hip: Secondary | ICD-10-CM | POA: Diagnosis not present

## 2014-06-16 DIAGNOSIS — M167 Other unilateral secondary osteoarthritis of hip: Secondary | ICD-10-CM | POA: Diagnosis not present

## 2014-06-16 DIAGNOSIS — M6281 Muscle weakness (generalized): Secondary | ICD-10-CM | POA: Diagnosis not present

## 2014-06-18 DIAGNOSIS — M1610 Unilateral primary osteoarthritis, unspecified hip: Secondary | ICD-10-CM | POA: Diagnosis not present

## 2014-06-18 DIAGNOSIS — M6281 Muscle weakness (generalized): Secondary | ICD-10-CM | POA: Diagnosis not present

## 2014-06-18 DIAGNOSIS — M167 Other unilateral secondary osteoarthritis of hip: Secondary | ICD-10-CM | POA: Diagnosis not present

## 2014-07-04 DIAGNOSIS — E7800 Pure hypercholesterolemia, unspecified: Secondary | ICD-10-CM | POA: Diagnosis not present

## 2014-07-04 DIAGNOSIS — E119 Type 2 diabetes mellitus without complications: Secondary | ICD-10-CM | POA: Diagnosis not present

## 2014-07-04 DIAGNOSIS — I10 Essential (primary) hypertension: Secondary | ICD-10-CM | POA: Diagnosis not present

## 2014-07-04 DIAGNOSIS — D62 Acute posthemorrhagic anemia: Secondary | ICD-10-CM | POA: Diagnosis not present

## 2014-07-04 DIAGNOSIS — L989 Disorder of the skin and subcutaneous tissue, unspecified: Secondary | ICD-10-CM | POA: Diagnosis not present

## 2014-07-09 DIAGNOSIS — M25569 Pain in unspecified knee: Secondary | ICD-10-CM | POA: Diagnosis not present

## 2014-07-09 DIAGNOSIS — Z471 Aftercare following joint replacement surgery: Secondary | ICD-10-CM | POA: Diagnosis not present

## 2014-07-09 DIAGNOSIS — Z9660 Presence of unspecified orthopedic joint implant: Secondary | ICD-10-CM | POA: Diagnosis not present

## 2014-08-08 DIAGNOSIS — S335XXA Sprain of ligaments of lumbar spine, initial encounter: Secondary | ICD-10-CM | POA: Diagnosis not present

## 2014-08-08 DIAGNOSIS — Z96649 Presence of unspecified artificial hip joint: Secondary | ICD-10-CM | POA: Diagnosis not present

## 2014-08-08 DIAGNOSIS — Z471 Aftercare following joint replacement surgery: Secondary | ICD-10-CM | POA: Diagnosis not present

## 2014-08-13 DIAGNOSIS — L678 Other hair color and hair shaft abnormalities: Secondary | ICD-10-CM | POA: Diagnosis not present

## 2014-08-13 DIAGNOSIS — L821 Other seborrheic keratosis: Secondary | ICD-10-CM | POA: Diagnosis not present

## 2014-08-13 DIAGNOSIS — L738 Other specified follicular disorders: Secondary | ICD-10-CM | POA: Diagnosis not present

## 2014-08-13 DIAGNOSIS — D485 Neoplasm of uncertain behavior of skin: Secondary | ICD-10-CM | POA: Diagnosis not present

## 2014-08-13 DIAGNOSIS — Z85828 Personal history of other malignant neoplasm of skin: Secondary | ICD-10-CM | POA: Diagnosis not present

## 2014-08-14 DIAGNOSIS — E119 Type 2 diabetes mellitus without complications: Secondary | ICD-10-CM | POA: Diagnosis not present

## 2014-08-14 DIAGNOSIS — Z961 Presence of intraocular lens: Secondary | ICD-10-CM | POA: Diagnosis not present

## 2014-08-28 DIAGNOSIS — S335XXA Sprain of ligaments of lumbar spine, initial encounter: Secondary | ICD-10-CM | POA: Diagnosis not present

## 2014-08-29 DIAGNOSIS — Z23 Encounter for immunization: Secondary | ICD-10-CM | POA: Diagnosis not present

## 2014-09-02 DIAGNOSIS — S335XXA Sprain of ligaments of lumbar spine, initial encounter: Secondary | ICD-10-CM | POA: Diagnosis not present

## 2014-09-04 DIAGNOSIS — I6522 Occlusion and stenosis of left carotid artery: Secondary | ICD-10-CM | POA: Diagnosis not present

## 2014-09-04 DIAGNOSIS — Z48812 Encounter for surgical aftercare following surgery on the circulatory system: Secondary | ICD-10-CM | POA: Diagnosis not present

## 2014-09-18 DIAGNOSIS — S338XXA Sprain of other parts of lumbar spine and pelvis, initial encounter: Secondary | ICD-10-CM | POA: Diagnosis not present

## 2014-12-12 DIAGNOSIS — M79604 Pain in right leg: Secondary | ICD-10-CM | POA: Diagnosis not present

## 2015-01-02 DIAGNOSIS — E785 Hyperlipidemia, unspecified: Secondary | ICD-10-CM | POA: Diagnosis not present

## 2015-01-02 DIAGNOSIS — I10 Essential (primary) hypertension: Secondary | ICD-10-CM | POA: Diagnosis not present

## 2015-01-02 DIAGNOSIS — E1151 Type 2 diabetes mellitus with diabetic peripheral angiopathy without gangrene: Secondary | ICD-10-CM | POA: Diagnosis not present

## 2015-06-09 DIAGNOSIS — Z96641 Presence of right artificial hip joint: Secondary | ICD-10-CM | POA: Diagnosis not present

## 2015-06-09 DIAGNOSIS — Z471 Aftercare following joint replacement surgery: Secondary | ICD-10-CM | POA: Diagnosis not present

## 2015-07-01 DIAGNOSIS — E1151 Type 2 diabetes mellitus with diabetic peripheral angiopathy without gangrene: Secondary | ICD-10-CM | POA: Diagnosis not present

## 2015-07-01 DIAGNOSIS — I10 Essential (primary) hypertension: Secondary | ICD-10-CM | POA: Diagnosis not present

## 2015-08-08 IMAGING — CR DG CHEST 2V
2 series · 2 of 2 positions shown · non-contrast
Comparison: None.

CLINICAL DATA: Preoperative total hip replacement; hypertension

EXAM:
CHEST  2 VIEW

[w chest pa]
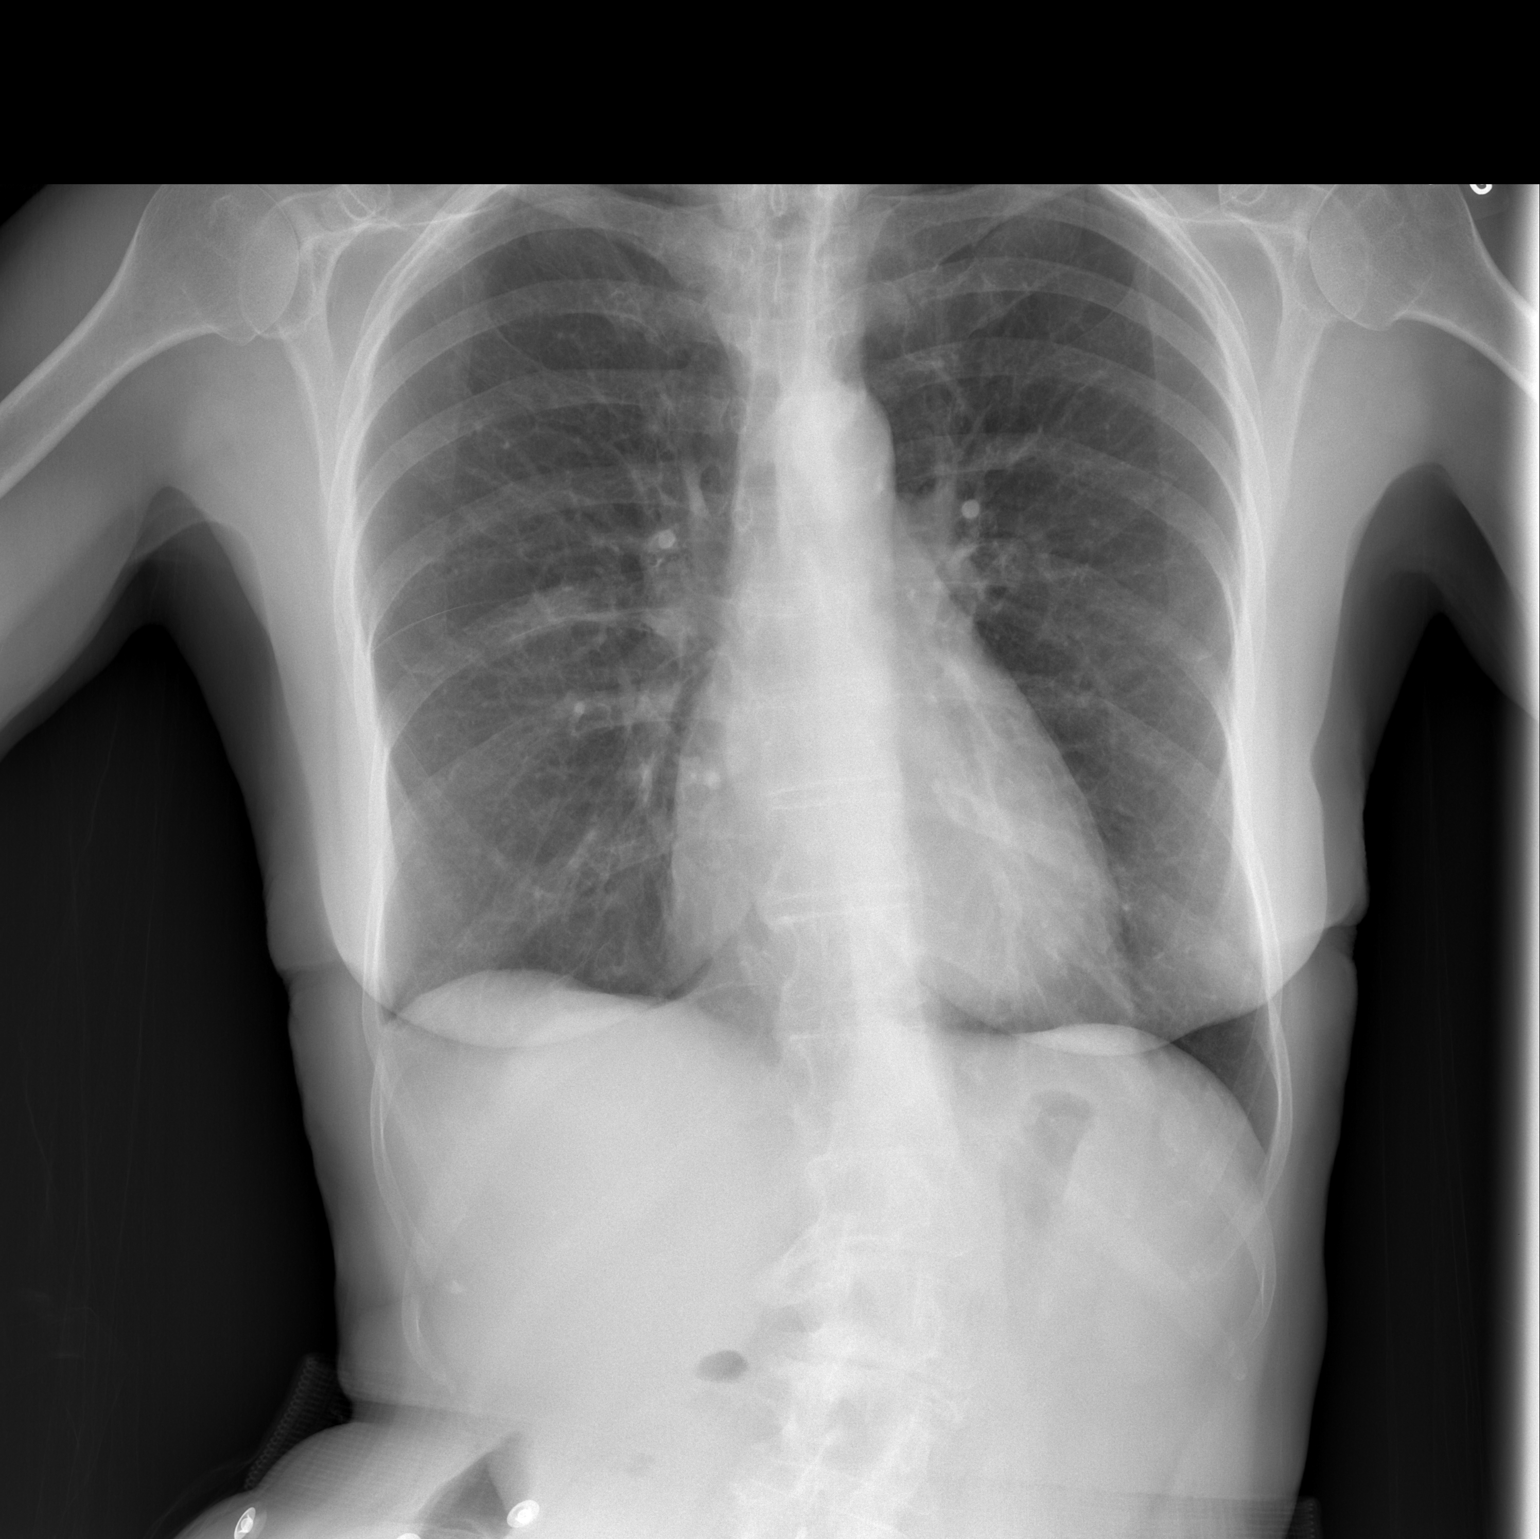

[w chest lat]
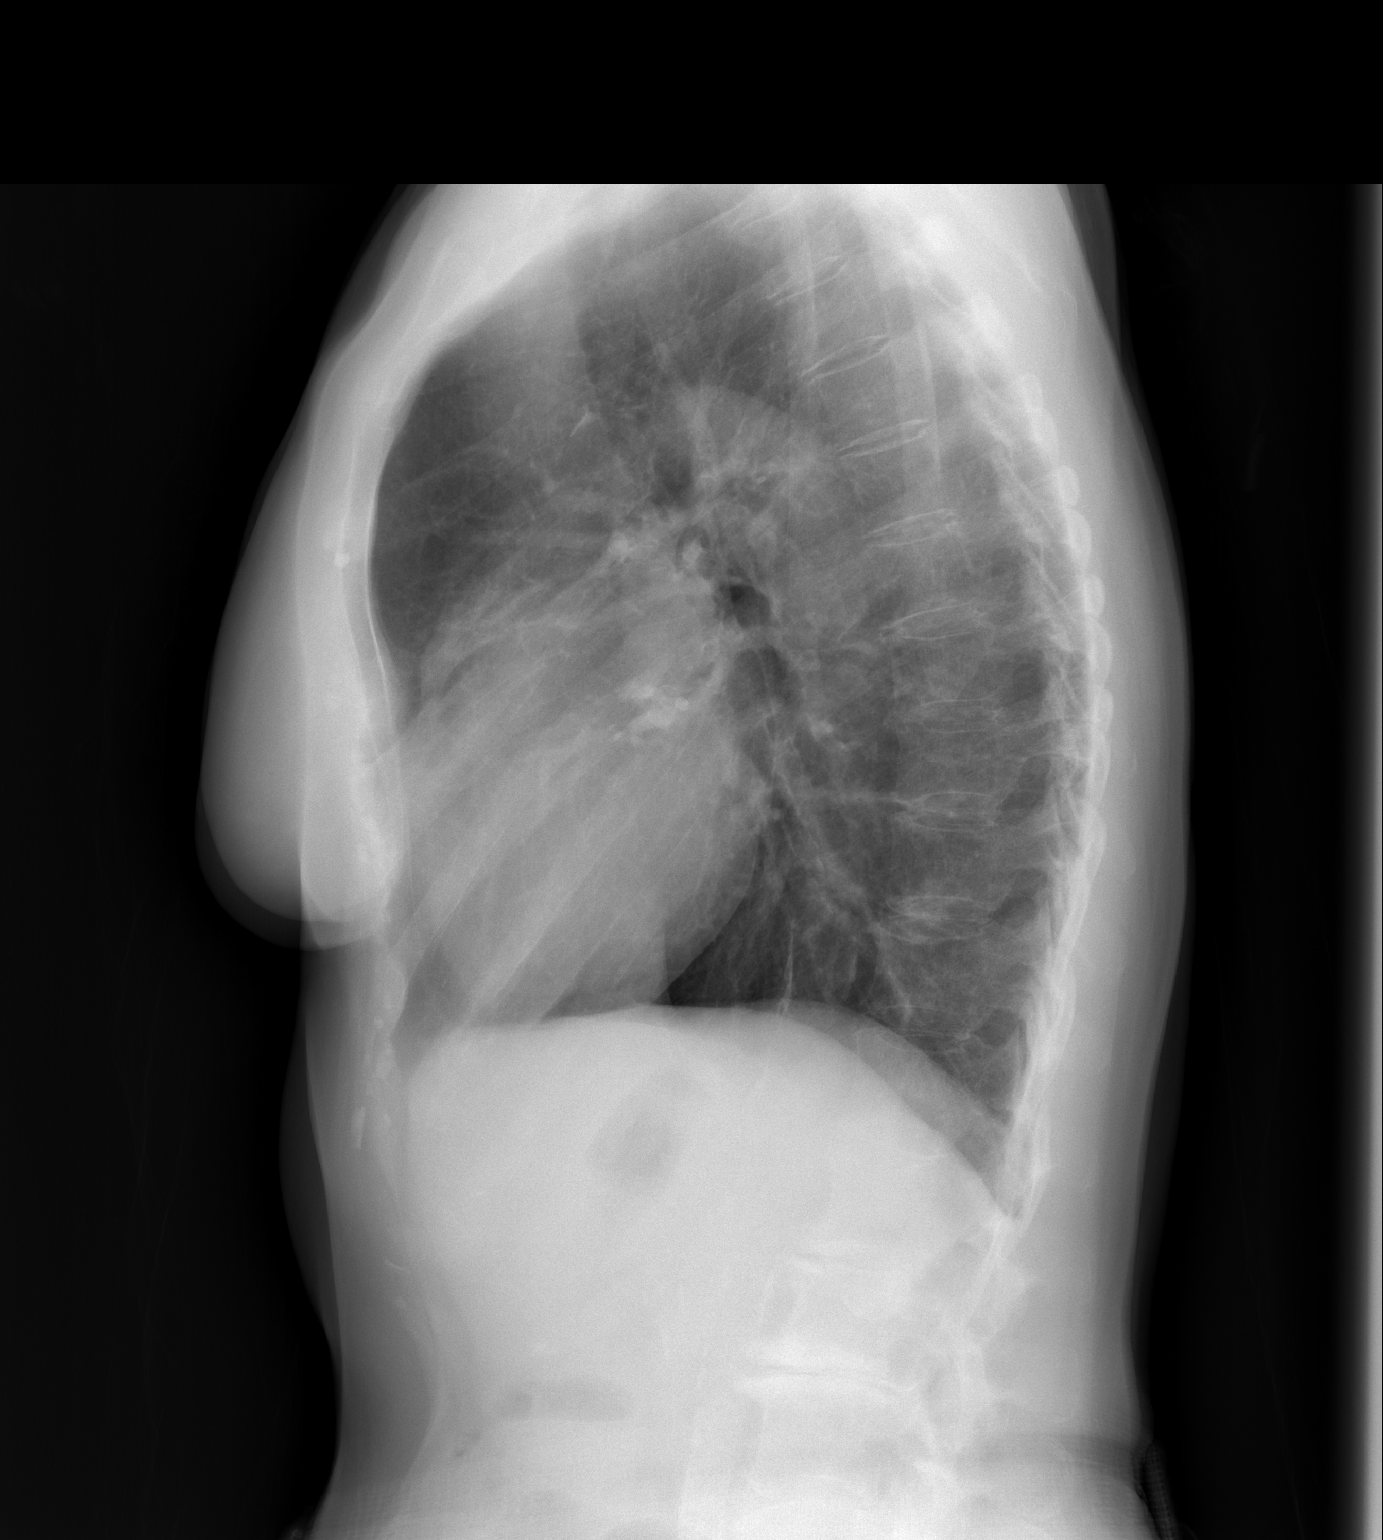

[2 of 2 positions shown; findings below may reference images not displayed]

FINDINGS: Lungs are clear. Heart size and pulmonary vascularity are normal. No
adenopathy. There is thoracolumbar levoscoliosis.
IMPRESSION: No edema or consolidation.

## 2015-08-19 DIAGNOSIS — L821 Other seborrheic keratosis: Secondary | ICD-10-CM | POA: Diagnosis not present

## 2015-08-19 DIAGNOSIS — R202 Paresthesia of skin: Secondary | ICD-10-CM | POA: Diagnosis not present

## 2015-08-19 DIAGNOSIS — L812 Freckles: Secondary | ICD-10-CM | POA: Diagnosis not present

## 2015-08-21 DIAGNOSIS — Z961 Presence of intraocular lens: Secondary | ICD-10-CM | POA: Diagnosis not present

## 2015-08-21 DIAGNOSIS — H26492 Other secondary cataract, left eye: Secondary | ICD-10-CM | POA: Diagnosis not present

## 2015-08-21 DIAGNOSIS — E119 Type 2 diabetes mellitus without complications: Secondary | ICD-10-CM | POA: Diagnosis not present

## 2015-09-07 DIAGNOSIS — Z23 Encounter for immunization: Secondary | ICD-10-CM | POA: Diagnosis not present

## 2015-12-30 DIAGNOSIS — I10 Essential (primary) hypertension: Secondary | ICD-10-CM | POA: Diagnosis not present

## 2015-12-30 DIAGNOSIS — E785 Hyperlipidemia, unspecified: Secondary | ICD-10-CM | POA: Diagnosis not present

## 2015-12-30 DIAGNOSIS — Z7984 Long term (current) use of oral hypoglycemic drugs: Secondary | ICD-10-CM | POA: Diagnosis not present

## 2015-12-30 DIAGNOSIS — E1151 Type 2 diabetes mellitus with diabetic peripheral angiopathy without gangrene: Secondary | ICD-10-CM | POA: Diagnosis not present

## 2016-03-07 DIAGNOSIS — B0052 Herpesviral keratitis: Secondary | ICD-10-CM | POA: Diagnosis not present

## 2016-03-14 DIAGNOSIS — B0052 Herpesviral keratitis: Secondary | ICD-10-CM | POA: Diagnosis not present

## 2016-04-15 DIAGNOSIS — B0052 Herpesviral keratitis: Secondary | ICD-10-CM | POA: Diagnosis not present

## 2016-04-29 DIAGNOSIS — B0052 Herpesviral keratitis: Secondary | ICD-10-CM | POA: Diagnosis not present

## 2016-05-24 DIAGNOSIS — D485 Neoplasm of uncertain behavior of skin: Secondary | ICD-10-CM | POA: Diagnosis not present

## 2016-05-24 DIAGNOSIS — C44311 Basal cell carcinoma of skin of nose: Secondary | ICD-10-CM | POA: Diagnosis not present

## 2016-05-31 DIAGNOSIS — B0052 Herpesviral keratitis: Secondary | ICD-10-CM | POA: Diagnosis not present

## 2016-05-31 DIAGNOSIS — H40041 Steroid responder, right eye: Secondary | ICD-10-CM | POA: Diagnosis not present

## 2016-06-28 DIAGNOSIS — Z7984 Long term (current) use of oral hypoglycemic drugs: Secondary | ICD-10-CM | POA: Diagnosis not present

## 2016-06-28 DIAGNOSIS — E1151 Type 2 diabetes mellitus with diabetic peripheral angiopathy without gangrene: Secondary | ICD-10-CM | POA: Diagnosis not present

## 2016-06-28 DIAGNOSIS — I10 Essential (primary) hypertension: Secondary | ICD-10-CM | POA: Diagnosis not present

## 2016-06-29 DIAGNOSIS — B0052 Herpesviral keratitis: Secondary | ICD-10-CM | POA: Diagnosis not present

## 2016-08-03 DIAGNOSIS — C44311 Basal cell carcinoma of skin of nose: Secondary | ICD-10-CM | POA: Diagnosis not present

## 2016-08-17 DIAGNOSIS — C44311 Basal cell carcinoma of skin of nose: Secondary | ICD-10-CM | POA: Diagnosis not present

## 2016-09-15 DIAGNOSIS — Z23 Encounter for immunization: Secondary | ICD-10-CM | POA: Diagnosis not present

## 2016-10-17 DIAGNOSIS — Z961 Presence of intraocular lens: Secondary | ICD-10-CM | POA: Diagnosis not present

## 2016-10-17 DIAGNOSIS — D3131 Benign neoplasm of right choroid: Secondary | ICD-10-CM | POA: Diagnosis not present

## 2016-10-17 DIAGNOSIS — B0052 Herpesviral keratitis: Secondary | ICD-10-CM | POA: Diagnosis not present

## 2016-10-17 DIAGNOSIS — E119 Type 2 diabetes mellitus without complications: Secondary | ICD-10-CM | POA: Diagnosis not present

## 2016-12-29 DIAGNOSIS — E1151 Type 2 diabetes mellitus with diabetic peripheral angiopathy without gangrene: Secondary | ICD-10-CM | POA: Diagnosis not present

## 2016-12-29 DIAGNOSIS — E785 Hyperlipidemia, unspecified: Secondary | ICD-10-CM | POA: Diagnosis not present

## 2016-12-29 DIAGNOSIS — I10 Essential (primary) hypertension: Secondary | ICD-10-CM | POA: Diagnosis not present

## 2017-01-05 DIAGNOSIS — I10 Essential (primary) hypertension: Secondary | ICD-10-CM | POA: Diagnosis not present

## 2017-02-20 ENCOUNTER — Emergency Department (HOSPITAL_COMMUNITY): Payer: Medicare Other

## 2017-02-20 ENCOUNTER — Encounter (HOSPITAL_COMMUNITY): Payer: Self-pay | Admitting: Emergency Medicine

## 2017-02-20 ENCOUNTER — Inpatient Hospital Stay (HOSPITAL_COMMUNITY)
Admission: EM | Admit: 2017-02-20 | Discharge: 2017-02-25 | DRG: 392 | Disposition: A | Discharge status: Home / Self Care | Payer: Medicare Other | Attending: Internal Medicine | Admitting: Internal Medicine

## 2017-02-20 DIAGNOSIS — E869 Volume depletion, unspecified: Secondary | ICD-10-CM | POA: Diagnosis not present

## 2017-02-20 DIAGNOSIS — N179 Acute kidney failure, unspecified: Secondary | ICD-10-CM | POA: Diagnosis not present

## 2017-02-20 DIAGNOSIS — Z7982 Long term (current) use of aspirin: Secondary | ICD-10-CM

## 2017-02-20 DIAGNOSIS — B954 Other streptococcus as the cause of diseases classified elsewhere: Secondary | ICD-10-CM | POA: Diagnosis present

## 2017-02-20 DIAGNOSIS — Z96649 Presence of unspecified artificial hip joint: Secondary | ICD-10-CM

## 2017-02-20 DIAGNOSIS — I10 Essential (primary) hypertension: Secondary | ICD-10-CM

## 2017-02-20 DIAGNOSIS — A084 Viral intestinal infection, unspecified: Principal | ICD-10-CM | POA: Diagnosis present

## 2017-02-20 DIAGNOSIS — E119 Type 2 diabetes mellitus without complications: Secondary | ICD-10-CM | POA: Diagnosis present

## 2017-02-20 DIAGNOSIS — E785 Hyperlipidemia, unspecified: Secondary | ICD-10-CM | POA: Diagnosis present

## 2017-02-20 DIAGNOSIS — E872 Acidosis: Secondary | ICD-10-CM | POA: Diagnosis not present

## 2017-02-20 DIAGNOSIS — Z7984 Long term (current) use of oral hypoglycemic drugs: Secondary | ICD-10-CM

## 2017-02-20 DIAGNOSIS — R197 Diarrhea, unspecified: Secondary | ICD-10-CM

## 2017-02-20 DIAGNOSIS — A09 Infectious gastroenteritis and colitis, unspecified: Secondary | ICD-10-CM

## 2017-02-20 DIAGNOSIS — E1169 Type 2 diabetes mellitus with other specified complication: Secondary | ICD-10-CM

## 2017-02-20 DIAGNOSIS — D72829 Elevated white blood cell count, unspecified: Secondary | ICD-10-CM | POA: Diagnosis present

## 2017-02-20 DIAGNOSIS — Z96641 Presence of right artificial hip joint: Secondary | ICD-10-CM | POA: Diagnosis present

## 2017-02-20 DIAGNOSIS — Z79899 Other long term (current) drug therapy: Secondary | ICD-10-CM

## 2017-02-20 DIAGNOSIS — J9811 Atelectasis: Secondary | ICD-10-CM | POA: Diagnosis present

## 2017-02-20 DIAGNOSIS — N390 Urinary tract infection, site not specified: Secondary | ICD-10-CM | POA: Diagnosis present

## 2017-02-20 DIAGNOSIS — H9190 Unspecified hearing loss, unspecified ear: Secondary | ICD-10-CM | POA: Diagnosis present

## 2017-02-20 DIAGNOSIS — R109 Unspecified abdominal pain: Secondary | ICD-10-CM | POA: Diagnosis not present

## 2017-02-20 LAB — COMPREHENSIVE METABOLIC PANEL
ALBUMIN: 4.3 g/dL (ref 3.5–5.0)
ALT: 23 U/L (ref 14–54)
AST: 24 U/L (ref 15–41)
Alkaline Phosphatase: 99 U/L (ref 38–126)
Anion gap: 10 (ref 5–15)
BUN: 46 mg/dL — ABNORMAL HIGH (ref 6–20)
CO2: 22 mmol/L (ref 22–32)
Calcium: 10.3 mg/dL (ref 8.9–10.3)
Chloride: 100 mmol/L — ABNORMAL LOW (ref 101–111)
Creatinine, Ser: 1.99 mg/dL — ABNORMAL HIGH (ref 0.44–1.00)
GFR calc Af Amer: 25 mL/min — ABNORMAL LOW (ref >60)
GFR calc non Af Amer: 22 mL/min — ABNORMAL LOW (ref >60)
GLUCOSE: 128 mg/dL — AB (ref 65–99)
Potassium: 4.3 mmol/L (ref 3.5–5.1)
Sodium: 132 mmol/L — ABNORMAL LOW (ref 135–145)
Total Bilirubin: 0.9 mg/dL (ref 0.3–1.2)
Total Protein: 8.2 g/dL — ABNORMAL HIGH (ref 6.5–8.1)

## 2017-02-20 LAB — CBC
HCT: 42.3 % (ref 36.0–46.0)
Hemoglobin: 14.1 g/dL (ref 12.0–15.0)
MCH: 30.5 pg (ref 26.0–34.0)
MCHC: 33.3 g/dL (ref 30.0–36.0)
MCV: 91.4 fL (ref 78.0–100.0)
PLATELETS: 294 10*3/uL (ref 150–400)
RBC: 4.63 MIL/uL (ref 3.87–5.11)
RDW: 13.6 % (ref 11.5–15.5)
WBC: 32.1 10*3/uL — ABNORMAL HIGH (ref 4.0–10.5)

## 2017-02-20 LAB — LIPASE, BLOOD: LIPASE: 77 U/L — AB (ref 11–51)

## 2017-02-20 MED ORDER — SODIUM CHLORIDE 0.9 % IV SOLN: INTRAVENOUS | Status: DC

## 2017-02-20 MED ORDER — ENOXAPARIN SODIUM 40 MG/0.4ML ~~LOC~~ SOLN: 40.0000 mg | Freq: Every day | SUBCUTANEOUS | Status: DC

## 2017-02-20 MED ORDER — IOPAMIDOL (ISOVUE-300) INJECTION 61%
INTRAVENOUS | Status: AC
  Administered 2017-02-20: 30 mL via ORAL
  Filled 2017-02-20: qty 30

## 2017-02-20 MED ORDER — SODIUM CHLORIDE 0.9 % IV BOLUS (SEPSIS)
2000.0000 mL | Freq: Once | INTRAVENOUS | Status: AC
  Administered 2017-02-20: 2000 mL via INTRAVENOUS

## 2017-02-20 MED ORDER — SODIUM CHLORIDE 0.9 % IV SOLN
INTRAVENOUS | Status: DC
  Administered 2017-02-20 – 2017-02-21 (×2): via INTRAVENOUS

## 2017-02-20 NOTE — ED Notes (Signed)
Pt unable to give urine sample, but aware that we need one.  

## 2017-02-20 NOTE — ED Triage Notes (Signed)
Pt reports she has had diarrhea since Thursday. No emesis. Intermittent abd discomfort.  Went to PCP for this, and was found to have elevated WBC in 30s.

## 2017-02-20 NOTE — ED Notes (Signed)
Pt states that she began having diarrhea and abdominal pain since last Thursday and has gotten worse, pt went to PCP today and had elevated WBC > 30 and was referred to the ED. Pt denies pain at this time, and continues to have nausea. Pt dtr is at bedside.

## 2017-02-20 NOTE — H&P (Signed)
History and Physical    Brandy Hawkins HKV:[id number] DOB: [birth year] DOA: 02/20/2017  PCP: Beatris Si  Patient coming from: Home  I have personally briefly reviewed patient's old medical records in [place]  Chief Complaint: Elevated WBC, Diarrhea  HPI: Brandy Hawkins is a 83 y.o. female with medical history significant of HTN.  Patient presents to the ED with c/o Diarrhea and abdominal discomfort for the past 4 days.  No fever nor chills, no N/V.  Patient notes that BP has been running very low for her at home over the past 4 days (56E systolic).  Patient went to doctor today, and outpatient blood work showed leukocytosis of 30k as well as AKI so patient was sent in to ED.  ED Course: BPs running 332R systolic, and now 518A systolic here in ED.   Review of Systems: As per HPI otherwise 10 point review of systems negative.   Past Medical History:  Diagnosis Date  . Arthritis    OA AND PAIN RIGHT HIP  . Carotid artery stenosis    SURGERY X 2 LEFT CAROTID--PT STATES NO DISEASE RT CAROTID AND HER LAST VISIT WITH VASCULAR DOCTOR - TOLD ALL OKAY  . Diabetes mellitus without complication ([place])   . Hearing impairment    SOMETIMES WEARS HEARING AIDS  . Hyperlipidemia   . Hypertension     Past Surgical History:  Procedure Laterality Date  . CAROTID SURGERY X 2 LEFT SIDE  04/03/02 AND 3/1 04  . EYE SURGERY     05/30/07 RT EYE  08/01/07  LEFT EYE  . ORIF LEFT WRIST 09/07/94    . TOTAL HIP ARTHROPLASTY Right 05/06/2014   Procedure: RIGHT TOTAL HIP ARTHROPLASTY ANTERIOR APPROACH;  Surgeon: [name], MD;  Location: [facility];  Service: Orthopedics;  Laterality: Right;     reports that she has never smoked. She does not have any smokeless tobacco history on file. She reports that she does not drink alcohol or use drugs.  No Known Allergies  History reviewed. No pertinent family history.   Prior to Admission medications   Medication Sig Start Date End Date Taking? Authorizing  Provider  aspirin EC 81 MG tablet Take 81 mg by mouth daily.   Yes Historical Provider, MD  atorvastatin (LIPITOR) 40 MG tablet Take 40 mg by mouth at bedtime.    Yes Historical Provider, MD  Cholecalciferol (CVS D3 PO) Take 1 tablet by mouth daily.   Yes Historical Provider, MD  Cyanocobalamin (B-12 PO) Take 1 tablet by mouth daily.   Yes Historical Provider, MD  ezetimibe (ZETIA) 10 MG tablet Take 10 mg by mouth at bedtime.    Yes Historical Provider, MD  loperamide (IMODIUM A-D) 2 MG tablet Take 2-4 mg by mouth 4 (four) times daily as needed for diarrhea or loose stools.   Yes Historical Provider, MD  metFORMIN (GLUCOPHAGE) 500 MG tablet Take 500 mg by mouth 2 (two) times daily with a meal.   Yes Historical Provider, MD  metroNIDAZOLE (FLAGYL) 500 MG tablet Take 500 mg by mouth 3 (three) times daily. 02/20/17  Yes Historical Provider, MD  olmesartan-hydrochlorothiazide (BENICAR HCT) 40-25 MG per tablet Take 1 tablet by mouth every morning.   Yes Historical Provider, MD  ferrous sulfate 325 (65 FE) MG tablet Take 1 tablet (325 mg total) by mouth 3 (three) times daily after meals. Patient not taking: Reported on 02/20/2017 05/08/14   Danae Orleans, PA-C    Physical Exam: Vitals:   02/20/17 1753  02/20/17 2024 02/20/17 2114 02/20/17 2245  BP: (!) 148/69 (!) 176/69 (!) 183/75 (!) 198/78  Pulse: 94 85 80 89  Resp:  16 18 18   Temp: 97.8 F (36.6 C) 97.7 F (36.5 C)    TempSrc: Oral Oral    SpO2: 99% 100% 99% 97%  Weight: 54.1 kg (119 lb 3 oz)     Height: 5' 4.5" (1.638 m)       Constitutional: NAD, calm, comfortable Eyes: PERRL, lids and conjunctivae normal ENMT: Mucous membranes are moist. Posterior pharynx clear of any exudate or lesions.Normal dentition.  Neck: normal, supple, no masses, no thyromegaly Respiratory: clear to auscultation bilaterally, no wheezing, no crackles. Normal respiratory effort. No accessory muscle use.  Cardiovascular: Regular rate and rhythm, no murmurs / rubs /  gallops. No extremity edema. 2+ pedal pulses. No carotid bruits.  Abdomen: no tenderness, no masses palpated. No hepatosplenomegaly. Bowel sounds positive.  Musculoskeletal: no clubbing / cyanosis. No joint deformity upper and lower extremities. Good ROM, no contractures. Normal muscle tone.  Skin: no rashes, lesions, ulcers. No induration Neurologic: CN 2-12 grossly intact. Sensation intact, DTR normal. Strength 5/5 in all 4.  Psychiatric: Normal judgment and insight. Alert and oriented x 3. Normal mood.    Labs on Admission: I have personally reviewed following labs and imaging studies  CBC:  Recent Labs Lab 02/20/17 1832  WBC 32.1*  HGB 14.1  HCT 42.3  MCV 91.4  PLT 740   Basic Metabolic Panel:  Recent Labs Lab 02/20/17 1832  NA 132*  K 4.3  CL 100*  CO2 22  GLUCOSE 128*  BUN 46*  CREATININE 1.99*  CALCIUM 10.3   GFR: Estimated Creatinine Clearance: 17.3 mL/min (A) (by C-G formula based on SCr of 1.99 mg/dL (H)). Liver Function Tests:  Recent Labs Lab 02/20/17 1832  AST 24  ALT 23  ALKPHOS 99  BILITOT 0.9  PROT 8.2*  ALBUMIN 4.3    Recent Labs Lab 02/20/17 1832  LIPASE 77*   No results for input(s): AMMONIA in the last 168 hours. Coagulation Profile: No results for input(s): INR, PROTIME in the last 168 hours. Cardiac Enzymes: No results for input(s): CKTOTAL, CKMB, CKMBINDEX, TROPONINI in the last 168 hours. BNP (last 3 results) No results for input(s): PROBNP in the last 8760 hours. HbA1C: No results for input(s): HGBA1C in the last 72 hours. CBG: No results for input(s): GLUCAP in the last 168 hours. Lipid Profile: No results for input(s): CHOL, HDL, LDLCALC, TRIG, CHOLHDL, LDLDIRECT in the last 72 hours. Thyroid Function Tests: No results for input(s): TSH, T4TOTAL, FREET4, T3FREE, THYROIDAB in the last 72 hours. Anemia Panel: No results for input(s): VITAMINB12, FOLATE, FERRITIN, TIBC, IRON, RETICCTPCT in the last 72 hours. Urine  analysis:    Component Value Date/Time   COLORURINE YELLOW 05/01/2014 [place] 05/01/2014 1114   LABSPEC 1.017 05/01/2014 1114   PHURINE 7.5 05/01/2014 1114   GLUCOSEU NEGATIVE 05/01/2014 1114   HGBUR NEGATIVE 05/01/2014 1114   BILIRUBINUR NEGATIVE 05/01/2014 1114   KETONESUR NEGATIVE 05/01/2014 1114   PROTEINUR NEGATIVE 05/01/2014 1114   UROBILINOGEN 1.0 05/01/2014 1114   NITRITE NEGATIVE 05/01/2014 1114   LEUKOCYTESUR SMALL (A) 05/01/2014 1114    Radiological Exams on Admission: Ct Abdomen Pelvis Wo Contrast  Result Date: 02/20/2017 CLINICAL DATA:  Diarrhea and abdominal pain EXAM: CT ABDOMEN AND PELVIS WITHOUT CONTRAST TECHNIQUE: Multidetector CT imaging of the abdomen and pelvis was performed following the standard protocol without IV contrast. COMPARISON:  None. FINDINGS: Lower chest: There is an area of consolidation within the lower aspect of the right upper lobe, incompletely visualized. There is bibasilar dependent atelectasis. There are coronary artery and cardiac valvular calcifications. Hepatobiliary: Normal hepatic size and contours. No perihepatic ascites. No intra- or extrahepatic biliary dilatation. The gallbladder is dilated without other focal abnormality. Pancreas: Normal pancreatic contours. No peripancreatic fluid collection or pancreatic ductal dilatation. Spleen: Normal. Adrenals/Urinary Tract: Normal adrenal glands. Intermediate attenuation lesion of the left renal pelvis is likely a hemorrhagic cyst. Multiple smaller but similar lesions are seen in the right kidney. Kidneys are otherwise unremarkable. Stomach/Bowel: The colon is largely fluid-filled. There is no small bowel dilatation. No evidence of acute inflammation. Normal appendix. Vascular/Lymphatic: There is atherosclerotic calcification of the non aneurysmal abdominal aorta. No abdominal or pelvic adenopathy. Reproductive: Calcified fibroid at the uterine fundus. No adnexal mass. Musculoskeletal:  Status post right total hip arthroplasty. Multilevel lumbar osteophytosis and facet arthrosis. No bony spinal canal narrowing. No lytic or blastic lesions. Normal visualized extrathoracic and extraperitoneal soft tissues. Other: No contributory non-categorized findings. IMPRESSION: 1. Fluid-filled colon and small bowel is consistent with acute diarrheal illness. No evidence of active inflammation or obstruction. 2. Incompletely visualized area of consolidation within the lower aspect of the right upper lobe. This may indicate subsegmental atelectasis, an area of infection or neoplastic process. At a minimum, follow-up scan of the chest in 4-6 weeks is recommended to ensure resolution and to confirm that there is no associated mass. 3. Aortic and coronary artery calcific atherosclerosis. 4. Dilated gallbladder without other evidence of acute cholecystitis. 5. Intermediate attenuation lesion of the left renal pelvis is statistically most likely to be a hemorrhagic cyst, but is incompletely evaluated on this study. Correlation with abdominal MRI with and without contrast or renal mass protocol CT is recommended on a outpatient basis. Multiple smaller lesions of the same densities are also seen within the right kidney. Electronically Signed   By: Ulyses Jarred M.D.   On: 02/20/2017 22:44    EKG: Independently reviewed.  Assessment/Plan Principal Problem:   Diarrhea of presumed infectious origin Active Problems:   AKI (acute kidney injury) ([place])   HTN (hypertension)    1. Diarrhea, presumed infectious - 1. Stool PCR and C.Diff quick scan pending 2. Continue imodium if C.Diff negative, stop imodium if C.Diff positive 3. Empiric cipro / flagyl (stop cipro if C.Diff positive) 4. Repeat CBC in AM to monitor leukocytosis 2. AKI - presumed pre-renal due to low BPs over last couple of days 1. Although BPs here in ED have actually been elevated 2. Will hold nephrotoxic benicar 3. Use PRN labetalol or  hydralazine if needed for BP while here 4. Repeat BMP in AM 3. HTN - 1. Hold Benicar 2. Use PRN labetalol or hydralazine if needed  DVT prophylaxis: Lovenox Code Status: Full Family Communication: Daughter at bedside Disposition Plan: Home after admit Consults called: None Admission status: Place in [place], [place] [facility] Pager [phone]  If 7AM-7PM, please contact day team taking care of patient [email or web address] Password [email or web address]  02/20/2017, 11:59 PM

## 2017-02-20 NOTE — ED Notes (Signed)
Pt still unable to give urine sample

## 2017-02-20 NOTE — ED Provider Notes (Signed)
[facility] DEPT Provider Note   CSN: [id number] Arrival date & time: 02/20/17  1741     History   Chief Complaint Chief Complaint  Patient presents with  . elevated WBC  . Diarrhea    HPI Brandy Hawkins is a 83 y.o. female.  83 year old female presents with watery diarrhea and abdominal discomfort 4 days. No fever or chills. No nausea or vomiting. Denies any travel history or recent antibiotic use. Went to her doctor today and had outpatient blood work which showed a leukocytosis of 30,000 as well as evidence of renal insufficiency. Patient denies any dysuria or hematuria. Abdominal discomfort has been diffuse and crampy. Patient sent here for further evaluation.      Past Medical History:  Diagnosis Date  . Arthritis    OA AND PAIN RIGHT HIP  . Carotid artery stenosis    SURGERY X 2 LEFT CAROTID--PT STATES NO DISEASE RT CAROTID AND HER LAST VISIT WITH VASCULAR DOCTOR - TOLD ALL OKAY  . Diabetes mellitus without complication ([place])   . Hearing impairment    SOMETIMES WEARS HEARING AIDS  . Hyperlipidemia   . Hypertension     Patient Active Problem List   Diagnosis Date Noted  . Expected blood loss anemia 05/07/2014  . S/P right THA, AA 05/06/2014    Past Surgical History:  Procedure Laterality Date  . CAROTID SURGERY X 2 LEFT SIDE  04/03/02 AND 3/1 04  . EYE SURGERY     05/30/07 RT EYE  08/01/07  LEFT EYE  . ORIF LEFT WRIST 09/07/94    . TOTAL HIP ARTHROPLASTY Right 05/06/2014   Procedure: RIGHT TOTAL HIP ARTHROPLASTY ANTERIOR APPROACH;  Surgeon: [name], MD;  Location: [facility];  Service: Orthopedics;  Laterality: Right;    OB History    No data available       Home Medications    Prior to Admission medications   Medication Sig Start Date End Date Taking? Authorizing Provider  atorvastatin (LIPITOR) 40 MG tablet Take 40 mg by mouth at bedtime.     Historical Provider, MD  docusate sodium 100 MG CAPS Take 100 mg by mouth 2 (two) times daily. 05/08/14    Danae Orleans, PA-C  ezetimibe (ZETIA) 10 MG tablet Take 10 mg by mouth at bedtime.     Historical Provider, MD  ferrous sulfate 325 (65 FE) MG tablet Take 1 tablet (325 mg total) by mouth 3 (three) times daily after meals. 05/08/14   Danae Orleans, PA-C  HYDROcodone-acetaminophen (NORCO) 7.5-325 MG per tablet Take 1-2 tablets by mouth every 4 (four) hours as needed for moderate pain. 05/08/14   Danae Orleans, PA-C  metFORMIN (GLUCOPHAGE) 500 MG tablet Take 500 mg by mouth 2 (two) times daily with a meal.    Historical Provider, MD  olmesartan-hydrochlorothiazide (BENICAR HCT) 40-25 MG per tablet Take 1 tablet by mouth every morning.    Historical Provider, MD  polyethylene glycol (MIRALAX / GLYCOLAX) packet Take 17 g by mouth 2 (two) times daily. 05/08/14   Danae Orleans, PA-C  tiZANidine (ZANAFLEX) 4 MG tablet Take 1 tablet (4 mg total) by mouth every 6 (six) hours as needed for muscle spasms. 05/08/14   Danae Orleans, PA-C    Family History History reviewed. No pertinent family history.  Social History Social History  Substance Use Topics  . Smoking status: Never Smoker  . Smokeless tobacco: Not on file  . Alcohol use No     Allergies   Patient has no known  allergies.   Review of Systems Review of Systems  All other systems reviewed and are negative.    Physical Exam Updated Vital Signs BP (!) 176/69 (BP Location: Left Arm)   Pulse 85   Temp 97.7 F (36.5 C) (Oral)   Resp 16   Ht 5' 4.5" (1.638 m)   Wt 54.1 kg   SpO2 100%   BMI 20.14 kg/m   Physical Exam  Constitutional: She is oriented to person, place, and time. She appears well-developed and well-nourished.  Non-toxic appearance. No distress.  HENT:  Head: Normocephalic and atraumatic.  Eyes: Conjunctivae, EOM and lids are normal. Pupils are equal, round, and reactive to light.  Neck: Normal range of motion. Neck supple. No tracheal deviation present. No thyroid mass present.  Cardiovascular: Normal rate, regular  rhythm and normal heart sounds.  Exam reveals no gallop.   No murmur heard. Pulmonary/Chest: Effort normal and breath sounds normal. No stridor. No respiratory distress. She has no decreased breath sounds. She has no wheezes. She has no rhonchi. She has no rales.  Abdominal: Soft. Normal appearance and bowel sounds are normal. She exhibits distension. There is generalized tenderness. There is no rebound and no CVA tenderness.    Musculoskeletal: Normal range of motion. She exhibits no edema or tenderness.  Neurological: She is alert and oriented to person, place, and time. She has normal strength. No cranial nerve deficit or sensory deficit. GCS eye subscore is 4. GCS verbal subscore is 5. GCS motor subscore is 6.  Skin: Skin is warm and dry. No abrasion and no rash noted.  Psychiatric: She has a normal mood and affect. Her speech is normal and behavior is normal.  Nursing note and vitals reviewed.    ED Treatments / Results  Labs (all labs ordered are listed, but only abnormal results are displayed) Labs Reviewed  LIPASE, BLOOD - Abnormal; Notable for the following:       Result Value   Lipase 77 (*)    All other components within normal limits  COMPREHENSIVE METABOLIC PANEL - Abnormal; Notable for the following:    Sodium 132 (*)    Chloride 100 (*)    Glucose, Bld 128 (*)    BUN 46 (*)    Creatinine, Ser 1.99 (*)    Total Protein 8.2 (*)    GFR calc non Af Amer 22 (*)    GFR calc Af Amer 25 (*)    All other components within normal limits  CBC - Abnormal; Notable for the following:    WBC 32.1 (*)    All other components within normal limits  URINALYSIS, ROUTINE W REFLEX MICROSCOPIC    EKG  EKG Interpretation None       Radiology No results found.  Procedures Procedures (including critical care time)  Medications Ordered in ED Medications  iopamidol (ISOVUE-300) 61 % injection (not administered)     Initial Impression / Assessment and Plan / ED Course  I  have reviewed the triage vital signs and the nursing notes.  Pertinent labs & imaging results that were available during my care of the patient were reviewed by me and considered in my medical decision making (see chart for details).     Patient given IV hydration here. Has evidence of acute kidney injury. Severe leukocytosis and abdominal CT performed which showed an acute diarrheal illness. C. difficile sent. Will be admitted  Final Clinical Impressions(s) / ED Diagnoses   Final diagnoses:  None    New  Prescriptions New Prescriptions   No medications on file     Lacretia Leigh, MD 02/20/17 2315

## 2017-02-20 NOTE — ED Notes (Signed)
Bed: FX25 Expected date:  Expected time:  Means of arrival:  Comments: Held for tri 3

## 2017-02-21 ENCOUNTER — Encounter (HOSPITAL_COMMUNITY): Payer: Self-pay | Admitting: General Practice

## 2017-02-21 DIAGNOSIS — E869 Volume depletion, unspecified: Secondary | ICD-10-CM | POA: Diagnosis present

## 2017-02-21 DIAGNOSIS — Z7984 Long term (current) use of oral hypoglycemic drugs: Secondary | ICD-10-CM | POA: Diagnosis not present

## 2017-02-21 DIAGNOSIS — N390 Urinary tract infection, site not specified: Secondary | ICD-10-CM | POA: Diagnosis present

## 2017-02-21 DIAGNOSIS — N3000 Acute cystitis without hematuria: Secondary | ICD-10-CM | POA: Diagnosis not present

## 2017-02-21 DIAGNOSIS — E119 Type 2 diabetes mellitus without complications: Secondary | ICD-10-CM | POA: Diagnosis present

## 2017-02-21 DIAGNOSIS — Z96641 Presence of right artificial hip joint: Secondary | ICD-10-CM | POA: Diagnosis present

## 2017-02-21 DIAGNOSIS — R197 Diarrhea, unspecified: Secondary | ICD-10-CM | POA: Diagnosis present

## 2017-02-21 DIAGNOSIS — N179 Acute kidney failure, unspecified: Secondary | ICD-10-CM | POA: Diagnosis not present

## 2017-02-21 DIAGNOSIS — A084 Viral intestinal infection, unspecified: Secondary | ICD-10-CM | POA: Diagnosis present

## 2017-02-21 DIAGNOSIS — B954 Other streptococcus as the cause of diseases classified elsewhere: Secondary | ICD-10-CM | POA: Diagnosis present

## 2017-02-21 DIAGNOSIS — Z7982 Long term (current) use of aspirin: Secondary | ICD-10-CM | POA: Diagnosis not present

## 2017-02-21 DIAGNOSIS — H9190 Unspecified hearing loss, unspecified ear: Secondary | ICD-10-CM | POA: Diagnosis present

## 2017-02-21 DIAGNOSIS — J9811 Atelectasis: Secondary | ICD-10-CM | POA: Diagnosis present

## 2017-02-21 DIAGNOSIS — I10 Essential (primary) hypertension: Secondary | ICD-10-CM | POA: Diagnosis present

## 2017-02-21 DIAGNOSIS — Z79899 Other long term (current) drug therapy: Secondary | ICD-10-CM | POA: Diagnosis not present

## 2017-02-21 DIAGNOSIS — D72829 Elevated white blood cell count, unspecified: Secondary | ICD-10-CM | POA: Diagnosis not present

## 2017-02-21 DIAGNOSIS — E785 Hyperlipidemia, unspecified: Secondary | ICD-10-CM | POA: Diagnosis not present

## 2017-02-21 DIAGNOSIS — A09 Infectious gastroenteritis and colitis, unspecified: Secondary | ICD-10-CM | POA: Diagnosis not present

## 2017-02-21 DIAGNOSIS — E1169 Type 2 diabetes mellitus with other specified complication: Secondary | ICD-10-CM | POA: Diagnosis not present

## 2017-02-21 DIAGNOSIS — E872 Acidosis: Secondary | ICD-10-CM | POA: Diagnosis present

## 2017-02-21 LAB — BASIC METABOLIC PANEL
Anion gap: 4 — ABNORMAL LOW (ref 5–15)
BUN: 36 mg/dL — AB (ref 6–20)
CALCIUM: 8.7 mg/dL — AB (ref 8.9–10.3)
CHLORIDE: 105 mmol/L (ref 101–111)
CO2: 22 mmol/L (ref 22–32)
CREATININE: 1.3 mg/dL — AB (ref 0.44–1.00)
GFR calc Af Amer: 42 mL/min — ABNORMAL LOW (ref >60)
GFR calc non Af Amer: 36 mL/min — ABNORMAL LOW (ref >60)
GLUCOSE: 95 mg/dL (ref 65–99)
Potassium: 3.6 mmol/L (ref 3.5–5.1)
Sodium: 131 mmol/L — ABNORMAL LOW (ref 135–145)

## 2017-02-21 LAB — URINALYSIS, COMPLETE (UACMP) WITH MICROSCOPIC
Bilirubin Urine: NEGATIVE
Glucose, UA: NEGATIVE mg/dL
KETONES UR: NEGATIVE mg/dL
NITRITE: NEGATIVE
PH: 5 (ref 5.0–8.0)
PROTEIN: NEGATIVE mg/dL
Specific Gravity, Urine: 1.009 (ref 1.005–1.030)

## 2017-02-21 LAB — GASTROINTESTINAL PANEL BY PCR, STOOL (REPLACES STOOL CULTURE)

## 2017-02-21 LAB — CBC
HEMATOCRIT: 38.6 % (ref 36.0–46.0)
HEMOGLOBIN: 13.2 g/dL (ref 12.0–15.0)
MCH: 31.1 pg (ref 26.0–34.0)
MCHC: 34.2 g/dL (ref 30.0–36.0)
MCV: 90.8 fL (ref 78.0–100.0)
Platelets: 268 10*3/uL (ref 150–400)
RBC: 4.25 MIL/uL (ref 3.87–5.11)
RDW: 13.5 % (ref 11.5–15.5)
WBC: 32.9 10*3/uL — ABNORMAL HIGH (ref 4.0–10.5)

## 2017-02-21 LAB — C DIFFICILE QUICK SCREEN W PCR REFLEX
C DIFFICLE (CDIFF) ANTIGEN: NEGATIVE
C Diff interpretation: NOT DETECTED
C Diff toxin: NEGATIVE

## 2017-02-21 MED ORDER — AMLODIPINE BESYLATE 5 MG PO TABS
5.0000 mg | ORAL_TABLET | Freq: Every day | ORAL | Status: DC
  Administered 2017-02-21 – 2017-02-23 (×3): 5 mg via ORAL
  Filled 2017-02-21 (×3): qty 1

## 2017-02-21 MED ORDER — ENOXAPARIN SODIUM 30 MG/0.3ML ~~LOC~~ SOLN
30.0000 mg | Freq: Every day | SUBCUTANEOUS | Status: DC
  Administered 2017-02-21 – 2017-02-22 (×2): 30 mg via SUBCUTANEOUS
  Filled 2017-02-21 (×2): qty 0.3

## 2017-02-21 MED ORDER — CIPROFLOXACIN IN D5W 400 MG/200ML IV SOLN
400.0000 mg | Freq: Every day | INTRAVENOUS | Status: DC
  Administered 2017-02-21: 400 mg via INTRAVENOUS
  Filled 2017-02-21: qty 200

## 2017-02-21 MED ORDER — ONDANSETRON HCL 4 MG/2ML IJ SOLN: 4.0000 mg | Freq: Four times a day (QID) | INTRAMUSCULAR | Status: DC | PRN

## 2017-02-21 MED ORDER — METRONIDAZOLE 500 MG PO TABS
500.0000 mg | ORAL_TABLET | Freq: Three times a day (TID) | ORAL | Status: DC
  Administered 2017-02-21: 500 mg via ORAL
  Filled 2017-02-21: qty 1

## 2017-02-21 MED ORDER — ASPIRIN EC 81 MG PO TBEC
81.0000 mg | DELAYED_RELEASE_TABLET | Freq: Every day | ORAL | Status: DC
  Administered 2017-02-21 – 2017-02-25 (×5): 81 mg via ORAL
  Filled 2017-02-21 (×5): qty 1

## 2017-02-21 MED ORDER — EZETIMIBE 10 MG PO TABS
10.0000 mg | ORAL_TABLET | Freq: Every day | ORAL | Status: DC
  Administered 2017-02-21 – 2017-02-24 (×5): 10 mg via ORAL
  Filled 2017-02-21 (×5): qty 1

## 2017-02-21 MED ORDER — HYDRALAZINE HCL 20 MG/ML IJ SOLN
10.0000 mg | Freq: Four times a day (QID) | INTRAMUSCULAR | Status: DC | PRN
  Administered 2017-02-22 – 2017-02-23 (×3): 10 mg via INTRAVENOUS
  Filled 2017-02-21 (×4): qty 1

## 2017-02-21 MED ORDER — SODIUM CHLORIDE 0.9 % IV SOLN
INTRAVENOUS | Status: AC
  Administered 2017-02-21 – 2017-02-22 (×2): via INTRAVENOUS
  Filled 2017-02-21 (×2): qty 1000

## 2017-02-21 MED ORDER — ATORVASTATIN CALCIUM 40 MG PO TABS
40.0000 mg | ORAL_TABLET | Freq: Every day | ORAL | Status: DC
  Administered 2017-02-21 – 2017-02-24 (×5): 40 mg via ORAL
  Filled 2017-02-21 (×6): qty 1

## 2017-02-21 NOTE — Progress Notes (Signed)
Rx Brief note:  Lovenox Rx adjusted Lovenox to 30 mg daily in pt with CrCl <30 ml/min  Thanks Dorrene German 02/21/2017 1:28 AM

## 2017-02-21 NOTE — Progress Notes (Signed)
Pharmacy Antibiotic Note  Brandy Hawkins is a 83 y.o. female with elevated WBC, diarrhea and abdominal pain admitted on 02/20/2017 with intra-abdominal infection.  Pharmacy has been consulted for cipro dosing.  Plan: Cipro 400 mg IV q24h Flagyl 500 mg po q8h (MD)  Height: 5\' 3"  (160 cm) Weight: 121 lb 11.1 oz (55.2 kg) IBW/kg (Calculated) : 52.4  Temp (24hrs), Avg:97.8 F (36.6 C), Min:97.7 F (36.5 C), Max:98 F (36.7 C)   Recent Labs Lab 02/20/17 1832  WBC 32.1*  CREATININE 1.99*    Estimated Creatinine Clearance: 16.8 mL/min (A) (by C-G formula based on SCr of 1.99 mg/dL (H)).    No Known Allergies  Antimicrobials this admission: 3/20 flagyl >>  3/20 cipro >>   Dose adjustments this admission:   Microbiology results:  BCx:   UCx:    Sputum:    MRSA PCR:   Thank you for allowing pharmacy to be a part of this patient's care.  Dorrene German 02/21/2017 2:05 AM

## 2017-02-21 NOTE — Progress Notes (Signed)
PROGRESS NOTE  Brandy Hawkins SPQ:[id number] DOB: 06-23-1929 DOA: 02/20/2017 PCP: Beatris Si  Brief History:  83 y/o female with history of DM2 and hypertension presents with 4 days of diarrhea. She states that she has some abdominal cramping which is relieved after bowel movement.  The patient denies and fevers, chills,  cp, sob, cough,  n/v, hematochezia, melena, dysuria, hematuria.  She denies any recent travels or eating raw or undercooked foods.  She denies any new medications or antibiotics.  She saw her PCP on 02/20/17 and lab work revealed WBC of 30K and patient was sent to the ED for further evaluation.  Upon admission, the patient was afebrile and hemodynamically stable, saturating well on RA.    Assessment/Plan: Diarrhea -C. Diff negative -stool pathogen panel -IVF -anti-motility agents prn -d/c cipro and flagyl -02/20/17 CT abd--dilated GB without ductal dilatation or inflammation; no bowel inflammation or obstruction; no nephrolithiasis  Leukocytosis -etiology unclear -Add diff to CBC-->?CLL -UA and urine culture -blood cultures x 2  AKI -secondary to volume depletion -baseline creatinine 0.7-0.9 -presenting creatinine 1.99 -continue IVF-->improving  HTN -holding benicar/HCTZ in setting of AKI -start amlodipine -hydralazine prn SBP > 180  Diabetes mellitus type 2 -holding metformin -novolog sliding scale -HbA1c  Hyperlipidemia -continue statin and zetia   Disposition Plan:   Home in 1-2 days  Family Communication:   No Family at bedside  Consultants:  none  Code Status:  FULL  DVT Prophylaxis:  [place] Lovenox   Procedures: As Listed in Progress Note Above  Antibiotics: None    Subjective: The patient continues to have diarrhea, but it is less frequent than in the past days. She denies any fevers, chills, chest pain with respect, cough, hemoptysis, nausea, vomiting, abdominal pain. No dysuria or hematuria. Denies any headache or neck  pain. No rashes.  Objective: Vitals:   02/20/17 2245 02/21/17 0120 02/21/17 0515 02/21/17 0952  BP: (!) 198/78 (!) 185/77 (!) 146/77 (!) 153/67  Pulse: 89 92 79 97  Resp: 18 18 18 18   Temp:  98 F (36.7 C) 98 F (36.7 C) 98.1 F (36.7 C)  TempSrc:  Oral Oral Oral  SpO2: 97% 99% 99% 99%  Weight:  55.2 kg (121 lb 11.1 oz)    Height:  5\' 3"  (1.6 m)      Intake/Output Summary (Last 24 hours) at 02/21/17 1315 Last data filed at 02/21/17 0942  Gross per 24 hour  Intake           636.67 ml  Output                0 ml  Net           636.67 ml   Weight change:  Exam:   General:  Pt is alert, follows commands appropriately, not in acute distress  HEENT: No icterus, No thrush, No neck mass, [place]/AT  Cardiovascular: RRR, S1/S2, no rubs, no gallops  Respiratory: CTA bilaterally, no wheezing, no crackles, no rhonchi  Abdomen: Soft/+BS, non tender, non distended, no guarding  Extremities: No edema, No lymphangitis, No petechiae, No rashes, no synovitis   Data Reviewed: I have personally reviewed following labs and imaging studies Basic Metabolic Panel:  Recent Labs Lab 02/20/17 1832 02/21/17 0524  NA 132* 131*  K 4.3 3.6  CL 100* 105  CO2 22 22  GLUCOSE 128* 95  BUN 46* 36*  CREATININE 1.99* 1.30*  CALCIUM 10.3 8.7*   Liver  Function Tests:  Recent Labs Lab 02/20/17 1832  AST 24  ALT 23  ALKPHOS 99  BILITOT 0.9  PROT 8.2*  ALBUMIN 4.3    Recent Labs Lab 02/20/17 1832  LIPASE 77*   No results for input(s): AMMONIA in the last 168 hours. Coagulation Profile: No results for input(s): INR, PROTIME in the last 168 hours. CBC:  Recent Labs Lab 02/20/17 1832 02/21/17 0524  WBC 32.1* 32.9*  HGB 14.1 13.2  HCT 42.3 38.6  MCV 91.4 90.8  PLT 294 268   Cardiac Enzymes: No results for input(s): CKTOTAL, CKMB, CKMBINDEX, TROPONINI in the last 168 hours. BNP: Invalid input(s): POCBNP CBG: No results for input(s): GLUCAP in the last 168  hours. HbA1C: No results for input(s): HGBA1C in the last 72 hours. Urine analysis:    Component Value Date/Time   COLORURINE YELLOW 05/01/2014 1114   APPEARANCEUR CLEAR 05/01/2014 1114   LABSPEC 1.017 05/01/2014 1114   PHURINE 7.5 05/01/2014 1114   GLUCOSEU NEGATIVE 05/01/2014 1114   HGBUR NEGATIVE 05/01/2014 1114   BILIRUBINUR NEGATIVE 05/01/2014 1114   KETONESUR NEGATIVE 05/01/2014 1114   PROTEINUR NEGATIVE 05/01/2014 1114   UROBILINOGEN 1.0 05/01/2014 1114   NITRITE NEGATIVE 05/01/2014 1114   LEUKOCYTESUR SMALL (A) 05/01/2014 1114   Sepsis Labs: @LABRCNTIP (procalcitonin:4,lacticidven:4) ) Recent Results (from the past 240 hour(s))  C difficile quick scan w PCR reflex     Status: None   Collection Time: 02/21/17 12:30 AM  Result Value Ref Range Status   C Diff antigen NEGATIVE NEGATIVE Final   C Diff toxin NEGATIVE NEGATIVE Final   C Diff interpretation No C. difficile detected.  Final     Scheduled Meds: . aspirin EC  81 mg Oral Daily  . atorvastatin  40 mg Oral QHS  . ciprofloxacin  400 mg Intravenous QHS  . enoxaparin (LOVENOX) injection  30 mg Subcutaneous Daily  . ezetimibe  10 mg Oral QHS  . metroNIDAZOLE  500 mg Oral TID   Continuous Infusions: . sodium chloride 125 mL/hr at 02/21/17 3810    Procedures/Studies: Ct Abdomen Pelvis Wo Contrast  Result Date: 02/20/2017 CLINICAL DATA:  Diarrhea and abdominal pain EXAM: CT ABDOMEN AND PELVIS WITHOUT CONTRAST TECHNIQUE: Multidetector CT imaging of the abdomen and pelvis was performed following the standard protocol without IV contrast. COMPARISON:  None. FINDINGS: Lower chest: There is an area of consolidation within the lower aspect of the right upper lobe, incompletely visualized. There is bibasilar dependent atelectasis. There are coronary artery and cardiac valvular calcifications. Hepatobiliary: Normal hepatic size and contours. No perihepatic ascites. No intra- or extrahepatic biliary dilatation. The gallbladder  is dilated without other focal abnormality. Pancreas: Normal pancreatic contours. No peripancreatic fluid collection or pancreatic ductal dilatation. Spleen: Normal. Adrenals/Urinary Tract: Normal adrenal glands. Intermediate attenuation lesion of the left renal pelvis is likely a hemorrhagic cyst. Multiple smaller but similar lesions are seen in the right kidney. Kidneys are otherwise unremarkable. Stomach/Bowel: The colon is largely fluid-filled. There is no small bowel dilatation. No evidence of acute inflammation. Normal appendix. Vascular/Lymphatic: There is atherosclerotic calcification of the non aneurysmal abdominal aorta. No abdominal or pelvic adenopathy. Reproductive: Calcified fibroid at the uterine fundus. No adnexal mass. Musculoskeletal: Status post right total hip arthroplasty. Multilevel lumbar osteophytosis and facet arthrosis. No bony spinal canal narrowing. No lytic or blastic lesions. Normal visualized extrathoracic and extraperitoneal soft tissues. Other: No contributory non-categorized findings. IMPRESSION: 1. Fluid-filled colon and small bowel is consistent with acute diarrheal illness. No evidence of active  inflammation or obstruction. 2. Incompletely visualized area of consolidation within the lower aspect of the right upper lobe. This may indicate subsegmental atelectasis, an area of infection or neoplastic process. At a minimum, follow-up scan of the chest in 4-6 weeks is recommended to ensure resolution and to confirm that there is no associated mass. 3. Aortic and coronary artery calcific atherosclerosis. 4. Dilated gallbladder without other evidence of acute cholecystitis. 5. Intermediate attenuation lesion of the left renal pelvis is statistically most likely to be a hemorrhagic cyst, but is incompletely evaluated on this study. Correlation with abdominal MRI with and without contrast or renal mass protocol CT is recommended on a outpatient basis. Multiple smaller lesions of the same  densities are also seen within the right kidney. Electronically Signed   By: Ulyses Jarred M.D.   On: 02/20/2017 22:44    Donice Alperin, DO  [facility] Pager [phone]  If 7PM-7AM, please contact night-coverage [email or web address] Password [email or web address] 02/21/2017, 1:15 PM   LOS: 0 days

## 2017-02-22 ENCOUNTER — Inpatient Hospital Stay (HOSPITAL_COMMUNITY): Payer: Medicare Other

## 2017-02-22 DIAGNOSIS — N390 Urinary tract infection, site not specified: Secondary | ICD-10-CM | POA: Diagnosis present

## 2017-02-22 DIAGNOSIS — N3000 Acute cystitis without hematuria: Secondary | ICD-10-CM

## 2017-02-22 DIAGNOSIS — D72829 Elevated white blood cell count, unspecified: Secondary | ICD-10-CM | POA: Diagnosis present

## 2017-02-22 LAB — CBC WITH DIFFERENTIAL/PLATELET
BASOS ABS: 0 10*3/uL (ref 0.0–0.1)
Basophils Relative: 0 %
EOS ABS: 17.8 10*3/uL — AB (ref 0.0–0.7)
EOS PCT: 60 %
HEMATOCRIT: 35.1 % — AB (ref 36.0–46.0)
Hemoglobin: 12 g/dL (ref 12.0–15.0)
Lymphocytes Relative: 9 %
Lymphs Abs: 2.7 10*3/uL (ref 0.7–4.0)
MCH: 31.7 pg (ref 26.0–34.0)
MCHC: 34.2 g/dL (ref 30.0–36.0)
MCV: 92.9 fL (ref 78.0–100.0)
MONO ABS: 0.9 10*3/uL (ref 0.1–1.0)
MONOS PCT: 3 %
NEUTROS PCT: 28 %
Neutro Abs: 8.3 10*3/uL — ABNORMAL HIGH (ref 1.7–7.7)
Platelets: 236 10*3/uL (ref 150–400)
RBC: 3.78 MIL/uL — ABNORMAL LOW (ref 3.87–5.11)
RDW: 14 % (ref 11.5–15.5)
WBC: 29.7 10*3/uL — AB (ref 4.0–10.5)

## 2017-02-22 LAB — BASIC METABOLIC PANEL
Anion gap: 4 — ABNORMAL LOW (ref 5–15)
BUN: 22 mg/dL — ABNORMAL HIGH (ref 6–20)
CHLORIDE: 118 mmol/L — AB (ref 101–111)
CO2: 18 mmol/L — ABNORMAL LOW (ref 22–32)
Calcium: 8.8 mg/dL — ABNORMAL LOW (ref 8.9–10.3)
Creatinine, Ser: 0.97 mg/dL (ref 0.44–1.00)
GFR calc non Af Amer: 51 mL/min — ABNORMAL LOW (ref >60)
GFR, EST AFRICAN AMERICAN: 60 mL/min — AB (ref >60)
Glucose, Bld: 106 mg/dL — ABNORMAL HIGH (ref 65–99)
POTASSIUM: 4.2 mmol/L (ref 3.5–5.1)
Sodium: 140 mmol/L (ref 135–145)

## 2017-02-22 LAB — MAGNESIUM: MAGNESIUM: 1.3 mg/dL — AB (ref 1.7–2.4)

## 2017-02-22 MED ORDER — SODIUM BICARBONATE 650 MG PO TABS
650.0000 mg | ORAL_TABLET | Freq: Two times a day (BID) | ORAL | Status: DC
  Administered 2017-02-22: 650 mg via ORAL
  Filled 2017-02-22 (×2): qty 1

## 2017-02-22 MED ORDER — DEXTROSE 5 % IV SOLN
1.0000 g | INTRAVENOUS | Status: DC
  Administered 2017-02-22 – 2017-02-24 (×3): 1 g via INTRAVENOUS
  Filled 2017-02-22 (×3): qty 10

## 2017-02-22 MED ORDER — MAGNESIUM SULFATE 4 GM/100ML IV SOLN
4.0000 g | Freq: Once | INTRAVENOUS | Status: AC
  Administered 2017-02-22: 4 g via INTRAVENOUS
  Filled 2017-02-22: qty 100

## 2017-02-22 MED ORDER — LOPERAMIDE HCL 2 MG PO CAPS
2.0000 mg | ORAL_CAPSULE | ORAL | Status: DC | PRN
  Administered 2017-02-23 – 2017-02-25 (×3): 2 mg via ORAL
  Filled 2017-02-22 (×3): qty 1

## 2017-02-22 MED ORDER — ENOXAPARIN SODIUM 40 MG/0.4ML ~~LOC~~ SOLN
40.0000 mg | Freq: Every day | SUBCUTANEOUS | Status: DC
  Administered 2017-02-23: 40 mg via SUBCUTANEOUS
  Filled 2017-02-22: qty 0.4

## 2017-02-22 NOTE — Progress Notes (Signed)
PROGRESS NOTE    Jacklynn Dehaas  OIZ:[id number] DOB: Oct 10, 1929 DOA: 02/20/2017 PCP: Beatris Si   Brief Narrative:  83 y/o female with history of DM2 and hypertension presents with 4 days of diarrhea. She states that she has some abdominal cramping which is relieved after bowel movement.  The patient denies and fevers, chills,  cp, sob, cough,  n/v, hematochezia, melena, dysuria, hematuria.  She denies any recent travels or eating raw or undercooked foods.  She denies any new medications or antibiotics.  She saw her PCP on 02/20/17 and lab work revealed WBC of 30K and patient was sent to the ED for further evaluation.  Upon admission, the patient was afebrile and hemodynamically stable, saturating well on RA.     Assessment & Plan:   Principal Problem:   Diarrhea of presumed infectious origin Active Problems:   AKI (acute kidney injury) ([place])   HTN (hypertension)   Diarrhea   UTI (urinary tract infection)  #1 diarrhea likely secondary to viral gastroenteritis Patient with some clinical improvement with improvement with consistency of stool. No further nausea or emesis. CT abdomen and pelvis consistent with a diarrheal illness and no other acute abnormalities. C. difficile PCR negative. Stool pathogen panel negative. Continue supportive care with IV fluids. Antiemetics. Antimotility agents as needed.  #2 UTI Urine cultures pending. Place on IV Rocephin. Follow.  #3 leukocytosis Likely secondary to UTI. CT abdomen and pelvis with no acute abnormalities. C. difficile PCR negative. Stool pathogen panel negative. Urine cultures pending. Blood cultures pending. Place on IV Rocephin. If no significant improvement with leukocytosis may need to have hematology assess patient. Follow.  #4 acute kidney injury Likely secondary to a prerenal azotemia. Improved with hydration. Follow.  #5 hypomagnesemia Replete.  #6 hypertension ACE inhibitor and HCTZ being held in this an acute kidney  injury. Continue Norvasc.  #7 diabetes mellitus type 2 Sliding scale insulin.  #8 hyperlipidemia Stable. Continue statin his Zetia.    DVT prophylaxis: Lovenox Code Status: Full Family Communication: Updated patient and daughter at bedside. Disposition Plan: Home when medically stable, urine cultures have resulted, improvement with leukocytosis.   Consultants:   None  Procedures:   CT abd/Pelvis 02/20/2017  CXR 02/22/2017  Antimicrobials:   IV Rocephin 02/22/2017  IV Cipro 02/21/2017 1 dose   Subjective: Patient denies any further nausea or emesis. Patient states loose stool improvement with consistency. Patient denies any chest pain. No shortness of breath.  Objective: Vitals:   02/22/17 0544 02/22/17 0853 02/22/17 1037 02/22/17 1414  BP: (!) 176/74 (!) 187/97 (!) 153/60 (!) 143/55  Pulse: 89 96 96 95  Resp: 18   19  Temp: 98.2 F (36.8 C)   97.5 F (36.4 C)  TempSrc: Oral   Oral  SpO2: 99%   99%  Weight:      Height:        Intake/Output Summary (Last 24 hours) at 02/22/17 1448 Last data filed at 02/22/17 1413  Gross per 24 hour  Intake             2110 ml  Output              100 ml  Net             2010 ml   Filed Weights   02/20/17 1753 02/21/17 0120  Weight: 54.1 kg (119 lb 3 oz) 55.2 kg (121 lb 11.1 oz)    Examination:  General exam: Appears calm and comfortable  Respiratory system: Clear  to auscultation. Respiratory effort normal. Cardiovascular system: S1 & S2 heard, RRR. No JVD, murmurs, rubs, gallops or clicks. No pedal edema. Gastrointestinal system: Abdomen is nondistended, soft and nontender. No organomegaly or masses felt. Normal bowel sounds heard. Central nervous system: Alert and oriented. No focal neurological deficits. Extremities: Symmetric 5 x 5 power. Skin: No rashes, lesions or ulcers Psychiatry: Judgement and insight appear normal. Mood & affect appropriate.     Data Reviewed: I have personally reviewed following labs  and imaging studies  CBC:  Recent Labs Lab 02/20/17 1832 02/21/17 0524 02/22/17 0521  WBC 32.1* 32.9* 29.7*  NEUTROABS  --   --  8.3*  HGB 14.1 13.2 12.0  HCT 42.3 38.6 35.1*  MCV 91.4 90.8 92.9  PLT 294 268 962   Basic Metabolic Panel:  Recent Labs Lab 02/20/17 1832 02/21/17 0524 02/22/17 0521  NA 132* 131* 140  K 4.3 3.6 4.2  CL 100* 105 118*  CO2 22 22 18*  GLUCOSE 128* 95 106*  BUN 46* 36* 22*  CREATININE 1.99* 1.30* 0.97  CALCIUM 10.3 8.7* 8.8*  MG  --   --  1.3*   GFR: Estimated Creatinine Clearance: 34.4 mL/min (by C-G formula based on SCr of 0.97 mg/dL). Liver Function Tests:  Recent Labs Lab 02/20/17 1832  AST 24  ALT 23  ALKPHOS 99  BILITOT 0.9  PROT 8.2*  ALBUMIN 4.3    Recent Labs Lab 02/20/17 1832  LIPASE 77*   No results for input(s): AMMONIA in the last 168 hours. Coagulation Profile: No results for input(s): INR, PROTIME in the last 168 hours. Cardiac Enzymes: No results for input(s): CKTOTAL, CKMB, CKMBINDEX, TROPONINI in the last 168 hours. BNP (last 3 results) No results for input(s): PROBNP in the last 8760 hours. HbA1C: No results for input(s): HGBA1C in the last 72 hours. CBG: No results for input(s): GLUCAP in the last 168 hours. Lipid Profile: No results for input(s): CHOL, HDL, LDLCALC, TRIG, CHOLHDL, LDLDIRECT in the last 72 hours. Thyroid Function Tests: No results for input(s): TSH, T4TOTAL, FREET4, T3FREE, THYROIDAB in the last 72 hours. Anemia Panel: No results for input(s): VITAMINB12, FOLATE, FERRITIN, TIBC, IRON, RETICCTPCT in the last 72 hours. Sepsis Labs: No results for input(s): PROCALCITON, LATICACIDVEN in the last 168 hours.  Recent Results (from the past 240 hour(s))  Gastrointestinal Panel by PCR , Stool     Status: None   Collection Time: 02/21/17 12:30 AM  Result Value Ref Range Status   Campylobacter species NOT DETECTED NOT DETECTED Final   Plesimonas shigelloides NOT DETECTED NOT DETECTED Final    Salmonella species NOT DETECTED NOT DETECTED Final   Yersinia enterocolitica NOT DETECTED NOT DETECTED Final   Vibrio species NOT DETECTED NOT DETECTED Final   Vibrio cholerae NOT DETECTED NOT DETECTED Final   Enteroaggregative E coli (EAEC) NOT DETECTED NOT DETECTED Final   Enteropathogenic E coli (EPEC) NOT DETECTED NOT DETECTED Final   Enterotoxigenic E coli (ETEC) NOT DETECTED NOT DETECTED Final   Shiga like toxin producing E coli (STEC) NOT DETECTED NOT DETECTED Final   Shigella/Enteroinvasive E coli (EIEC) NOT DETECTED NOT DETECTED Final   Cryptosporidium NOT DETECTED NOT DETECTED Final   Cyclospora cayetanensis NOT DETECTED NOT DETECTED Final   Entamoeba histolytica NOT DETECTED NOT DETECTED Final   Giardia lamblia NOT DETECTED NOT DETECTED Final   Adenovirus F40/41 NOT DETECTED NOT DETECTED Final   Astrovirus NOT DETECTED NOT DETECTED Final   Norovirus GI/GII NOT DETECTED NOT DETECTED Final  Rotavirus A NOT DETECTED NOT DETECTED Final   Sapovirus (I, II, IV, and V) NOT DETECTED NOT DETECTED Final  C difficile quick scan w PCR reflex     Status: None   Collection Time: 02/21/17 12:30 AM  Result Value Ref Range Status   C Diff antigen NEGATIVE NEGATIVE Final   C Diff toxin NEGATIVE NEGATIVE Final   C Diff interpretation No C. difficile detected.  Final  Culture, blood (Routine X 2) w Reflex to ID Panel     Status: None (Preliminary result)   Collection Time: 02/21/17  2:09 PM  Result Value Ref Range Status   Specimen Description   Final    BLOOD RIGHT ANTECUBITAL Performed at [facility], [address], [place], [place] [address]    Special Requests   Final    BLOOD [facility] Performed at [facility], [address], [place], [place] [address]    Culture   Final    NO GROWTH < 24 HOURS Performed at [facility], [place] [address], [place], [place] [address]    Report Status PENDING  Incomplete  Culture, blood (Routine X 2) w Reflex to ID Panel     Status:  None (Preliminary result)   Collection Time: 02/21/17  2:09 PM  Result Value Ref Range Status   Specimen Description   Final    BLOOD RIGHT ANTECUBITAL Performed at [facility], [address], [place], [place] [address]    Special Requests   Final    BLOOD [facility] Performed at [facility], [address], [place], [place] [address]    Culture   Final    NO GROWTH < 24 HOURS Performed at [facility], [place] [address] [place],  [address]    Report Status PENDING  Incomplete         Radiology Studies: Ct Abdomen Pelvis Wo Contrast  Result Date: 02/20/2017 CLINICAL DATA:  Diarrhea and abdominal pain EXAM: CT ABDOMEN AND PELVIS WITHOUT CONTRAST TECHNIQUE: Multidetector CT imaging of the abdomen and pelvis was performed following the standard protocol without IV contrast. COMPARISON:  None. FINDINGS: Lower chest: There is an area of consolidation within the lower aspect of the right upper lobe, incompletely visualized. There is bibasilar dependent atelectasis. There are coronary artery and cardiac valvular calcifications. Hepatobiliary: Normal hepatic size and contours. No perihepatic ascites. No intra- or extrahepatic biliary dilatation. The gallbladder is dilated without other focal abnormality. Pancreas: Normal pancreatic contours. No peripancreatic fluid collection or pancreatic ductal dilatation. Spleen: Normal. Adrenals/Urinary Tract: Normal adrenal glands. Intermediate attenuation lesion of the left renal pelvis is likely a hemorrhagic cyst. Multiple smaller but similar lesions are seen in the right kidney. Kidneys are otherwise unremarkable. Stomach/Bowel: The colon is largely fluid-filled. There is no small bowel dilatation. No evidence of acute inflammation. Normal appendix. Vascular/Lymphatic: There is atherosclerotic calcification of the non aneurysmal abdominal aorta. No abdominal or pelvic adenopathy. Reproductive: Calcified fibroid at the uterine fundus. No  adnexal mass. Musculoskeletal: Status post right total hip arthroplasty. Multilevel lumbar osteophytosis and facet arthrosis. No bony spinal canal narrowing. No lytic or blastic lesions. Normal visualized extrathoracic and extraperitoneal soft tissues. Other: No contributory non-categorized findings. IMPRESSION: 1. Fluid-filled colon and small bowel is consistent with acute diarrheal illness. No evidence of active inflammation or obstruction. 2. Incompletely visualized area of consolidation within the lower aspect of the right upper lobe. This may indicate subsegmental atelectasis, an area of infection or neoplastic process. At a minimum, follow-up scan of  the chest in 4-6 weeks is recommended to ensure resolution and to confirm that there is no associated mass. 3. Aortic and coronary artery calcific atherosclerosis. 4. Dilated gallbladder without other evidence of acute cholecystitis. 5. Intermediate attenuation lesion of the left renal pelvis is statistically most likely to be a hemorrhagic cyst, but is incompletely evaluated on this study. Correlation with abdominal MRI with and without contrast or renal mass protocol CT is recommended on a outpatient basis. Multiple smaller lesions of the same densities are also seen within the right kidney. Electronically Signed   By: Ulyses Jarred M.D.   On: 02/20/2017 22:44   Dg Chest 2 View  Result Date: 02/22/2017 CLINICAL DATA:  Leukocytosis EXAM: CHEST  2 VIEW COMPARISON:  05/01/2014 FINDINGS: Cardiac shadow is within normal limits. The lungs are well aerated bilaterally. No focal infiltrate or sizable effusion is noted. Mild scarring is noted in the right mid lung stable from the prior study. No acute bony abnormality is seen. IMPRESSION: No acute abnormality noted. Electronically Signed   By: Inez Catalina M.D.   On: 02/22/2017 11:17        Scheduled Meds: . amLODipine  5 mg Oral Daily  . aspirin EC  81 mg Oral Daily  . atorvastatin  40 mg Oral QHS  .  cefTRIAXone (ROCEPHIN)  IV  1 g Intravenous Q24H  . [START ON 02/23/2017] enoxaparin (LOVENOX) injection  40 mg Subcutaneous Daily  . ezetimibe  10 mg Oral QHS   Continuous Infusions:   LOS: 1 day    Time spent: 6 minutes    Charnita Trudel, MD [facility] Pager [phone]  If 7PM-7AM, please contact night-coverage [email or web address] Password [facility] 02/22/2017, 2:48 PM

## 2017-02-23 LAB — BASIC METABOLIC PANEL
Anion gap: 7 (ref 5–15)
BUN: 20 mg/dL (ref 6–20)
CHLORIDE: 114 mmol/L — AB (ref 101–111)
CO2: 16 mmol/L — AB (ref 22–32)
CREATININE: 0.84 mg/dL (ref 0.44–1.00)
Calcium: 8.6 mg/dL — ABNORMAL LOW (ref 8.9–10.3)
GFR calc Af Amer: >60 mL/min (ref >60)
GFR calc non Af Amer: >60 mL/min (ref >60)
GLUCOSE: 120 mg/dL — AB (ref 65–99)
POTASSIUM: 4.1 mmol/L (ref 3.5–5.1)
Sodium: 137 mmol/L (ref 135–145)

## 2017-02-23 LAB — URINE CULTURE: Culture: >=100000 — AB

## 2017-02-23 LAB — CBC WITH DIFFERENTIAL/PLATELET
BASOS PCT: 0 %
Basophils Absolute: 0 10*3/uL (ref 0.0–0.1)
EOS PCT: 57 %
Eosinophils Absolute: 15.3 10*3/uL — ABNORMAL HIGH (ref 0.0–0.7)
HCT: 31.4 % — ABNORMAL LOW (ref 36.0–46.0)
HEMOGLOBIN: 10.8 g/dL — AB (ref 12.0–15.0)
LYMPHS ABS: 2.2 10*3/uL (ref 0.7–4.0)
Lymphocytes Relative: 8 %
MCH: 31.9 pg (ref 26.0–34.0)
MCHC: 34.4 g/dL (ref 30.0–36.0)
MCV: 92.6 fL (ref 78.0–100.0)
MONOS PCT: 5 %
Monocytes Absolute: 1.3 10*3/uL — ABNORMAL HIGH (ref 0.1–1.0)
NEUTROS ABS: 8.1 10*3/uL — AB (ref 1.7–7.7)
Neutrophils Relative %: 30 %
Platelets: 223 10*3/uL (ref 150–400)
RBC: 3.39 MIL/uL — AB (ref 3.87–5.11)
RDW: 14.2 % (ref 11.5–15.5)
WBC: 26.9 10*3/uL — AB (ref 4.0–10.5)

## 2017-02-23 LAB — IRON AND TIBC
Iron: 36 ug/dL (ref 28–170)
SATURATION RATIOS: 14 % (ref 10.4–31.8)
TIBC: 258 ug/dL (ref 250–450)
UIBC: 222 ug/dL

## 2017-02-23 LAB — HEMOGLOBIN A1C
HEMOGLOBIN A1C: 6.4 % — AB (ref 4.8–5.6)
Mean Plasma Glucose: 137 mg/dL

## 2017-02-23 LAB — VITAMIN B12: Vitamin B-12: 1366 pg/mL — ABNORMAL HIGH (ref 180–914)

## 2017-02-23 LAB — FERRITIN: FERRITIN: 59 ng/mL (ref 11–307)

## 2017-02-23 LAB — MAGNESIUM: MAGNESIUM: 2 mg/dL (ref 1.7–2.4)

## 2017-02-23 LAB — PATHOLOGIST SMEAR REVIEW

## 2017-02-23 LAB — SAVE SMEAR

## 2017-02-23 LAB — FOLATE: Folate: 28 ng/mL (ref >5.9)

## 2017-02-23 MED ORDER — SODIUM BICARBONATE 8.4 % IV SOLN
INTRAVENOUS | Status: DC
  Administered 2017-02-23 – 2017-02-24 (×2): via INTRAVENOUS
  Filled 2017-02-23 (×2): qty 150

## 2017-02-23 MED ORDER — AMLODIPINE BESYLATE 5 MG PO TABS
5.0000 mg | ORAL_TABLET | Freq: Once | ORAL | Status: AC
  Administered 2017-02-23: 5 mg via ORAL
  Filled 2017-02-23: qty 1

## 2017-02-23 MED ORDER — AMLODIPINE BESYLATE 10 MG PO TABS
10.0000 mg | ORAL_TABLET | Freq: Every day | ORAL | Status: DC
  Administered 2017-02-24 – 2017-02-25 (×2): 10 mg via ORAL
  Filled 2017-02-23 (×2): qty 1

## 2017-02-23 MED ORDER — HYDRALAZINE HCL 20 MG/ML IJ SOLN
5.0000 mg | Freq: Once | INTRAMUSCULAR | Status: AC
  Administered 2017-02-23: 5 mg via INTRAVENOUS
  Filled 2017-02-23: qty 1

## 2017-02-23 NOTE — Progress Notes (Signed)
PROGRESS NOTE    Brandy Hawkins  IRJ:[id number] DOB: [birth year] DOA: 02/20/2017 PCP: Beatris Si   Brief Narrative:  83 y/o female with history of DM2 and hypertension presents with 4 days of diarrhea. She states that she has some abdominal cramping which is relieved after bowel movement.  The patient denies and fevers, chills,  cp, sob, cough,  n/v, hematochezia, melena, dysuria, hematuria.  She denies any recent travels or eating raw or undercooked foods.  She denies any new medications or antibiotics.  She saw her PCP on 02/20/17 and lab work revealed WBC of 30K and patient was sent to the ED for further evaluation.  Upon admission, the patient was afebrile and hemodynamically stable, saturating well on RA.     Assessment & Plan:   Principal Problem:   Diarrhea of presumed infectious origin Active Problems:   AKI (acute kidney injury) ([place])   HTN (hypertension)   Diarrhea   UTI (urinary tract infection)   Leukocytosis  #1 diarrhea likely secondary to viral gastroenteritis Patient with some clinical improvement with improvement with consistency of stool. No further nausea or emesis. CT abdomen and pelvis consistent with a diarrheal illness and no other acute abnormalities. C. difficile PCR negative. Stool pathogen panel negative. Continue supportive care with IV fluids. Antiemetics. Antimotility agents as needed.  #2 UTI Urine cultures pending. Continue IV Rocephin. Follow.  #3 leukocytosis Likely secondary to UTI. CT abdomen and pelvis with no acute abnormalities. C. difficile PCR negative. Stool pathogen panel negative. Urine cultures pending. Blood cultures pending. Continue IV Rocephin. If no significant improvement with leukocytosis may need to have hematology assess patient. Follow.  #4 acute kidney injury Likely secondary to a prerenal azotemia. Improved with hydration. ACE inhibitor and diuretic on hold. Follow.  #5 hypomagnesemia Repleted.  #6 hypertension ACE  inhibitor and HCTZ being held secondary to acute kidney injury. Increase Norvasc to 10mg .  #7 diabetes mellitus type 2 Sliding scale insulin.  #8 hyperlipidemia Stable. Continue statin and Zetia.  #9 metabolic acidosis Place on a bicarbonate drip.    DVT prophylaxis: Lovenox Code Status: Full Family Communication: Updated patient, no family at bedside. Disposition Plan: Home when medically stable, urine cultures have resulted, improvement with leukocytosis.   Consultants:   None  Procedures:   CT abd/Pelvis 02/20/2017  CXR 02/22/2017  Antimicrobials:   IV Rocephin 02/22/2017  IV Cipro 02/21/2017 1 dose   Subjective: Patient with multiple loose stools overnight states had about 9 loose stools. Patient states some improvement with loose stools today. No chest pain. No shortness of breath.   Objective: Vitals:   02/23/17 0537 02/23/17 0955 02/23/17 1112 02/23/17 1153  BP: (!) 149/43 (!) 193/83 (!) 186/76 (!) 156/60  Pulse: 79     Resp: 18     Temp: 97.9 F (36.6 C)     TempSrc: Oral     SpO2: 100%     Weight:      Height:        Intake/Output Summary (Last 24 hours) at 02/23/17 1221 Last data filed at 02/23/17 0854  Gross per 24 hour  Intake              640 ml  Output                0 ml  Net              640 ml   Filed Weights   02/20/17 1753 02/21/17 0120  Weight: 54.1 kg (119 lb  3 oz) 55.2 kg (121 lb 11.1 oz)    Examination:  General exam: Appears calm and comfortable  Respiratory system: Clear to auscultation. Respiratory effort normal. Cardiovascular system: S1 & S2 heard, RRR. No JVD, murmurs, rubs, gallops or clicks. No pedal edema. Gastrointestinal system: Abdomen is nondistended, soft and nontender. No organomegaly or masses felt. Normal bowel sounds heard. Central nervous system: Alert and oriented. No focal neurological deficits. Extremities: Symmetric 5 x 5 power. Skin: No rashes, lesions or ulcers Psychiatry: Judgement and insight  appear normal. Mood & affect appropriate.     Data Reviewed: I have personally reviewed following labs and imaging studies  CBC:  Recent Labs Lab 02/20/17 1832 02/21/17 0524 02/22/17 0521 02/23/17 0449  WBC 32.1* 32.9* 29.7* 26.9*  NEUTROABS  --   --  8.3* 8.1*  HGB 14.1 13.2 12.0 10.8*  HCT 42.3 38.6 35.1* 31.4*  MCV 91.4 90.8 92.9 92.6  PLT 294 268 236 741   Basic Metabolic Panel:  Recent Labs Lab 02/20/17 1832 02/21/17 0524 02/22/17 0521 02/23/17 0449  NA 132* 131* 140 137  K 4.3 3.6 4.2 4.1  CL 100* 105 118* 114*  CO2 22 22 18* 16*  GLUCOSE 128* 95 106* 120*  BUN 46* 36* 22* 20  CREATININE 1.99* 1.30* 0.97 0.84  CALCIUM 10.3 8.7* 8.8* 8.6*  MG  --   --  1.3* 2.0   GFR: Estimated Creatinine Clearance: 39.8 mL/min (by C-G formula based on SCr of 0.84 mg/dL). Liver Function Tests:  Recent Labs Lab 02/20/17 1832  AST 24  ALT 23  ALKPHOS 99  BILITOT 0.9  PROT 8.2*  ALBUMIN 4.3    Recent Labs Lab 02/20/17 1832  LIPASE 77*   No results for input(s): AMMONIA in the last 168 hours. Coagulation Profile: No results for input(s): INR, PROTIME in the last 168 hours. Cardiac Enzymes: No results for input(s): CKTOTAL, CKMB, CKMBINDEX, TROPONINI in the last 168 hours. BNP (last 3 results) No results for input(s): PROBNP in the last 8760 hours. HbA1C:  Recent Labs  02/22/17 0521  HGBA1C 6.4*   CBG: No results for input(s): GLUCAP in the last 168 hours. Lipid Profile: No results for input(s): CHOL, HDL, LDLCALC, TRIG, CHOLHDL, LDLDIRECT in the last 72 hours. Thyroid Function Tests: No results for input(s): TSH, T4TOTAL, FREET4, T3FREE, THYROIDAB in the last 72 hours. Anemia Panel:  Recent Labs  02/23/17 0820  VITAMINB12 1,366*  FOLATE 28.0  FERRITIN 59  TIBC 258  IRON 36   Sepsis Labs: No results for input(s): PROCALCITON, LATICACIDVEN in the last 168 hours.  Recent Results (from the past 240 hour(s))  Gastrointestinal Panel by PCR ,  Stool     Status: None   Collection Time: 02/21/17 12:30 AM  Result Value Ref Range Status   Campylobacter species NOT DETECTED NOT DETECTED Final   Plesimonas shigelloides NOT DETECTED NOT DETECTED Final   Salmonella species NOT DETECTED NOT DETECTED Final   Yersinia enterocolitica NOT DETECTED NOT DETECTED Final   Vibrio species NOT DETECTED NOT DETECTED Final   Vibrio cholerae NOT DETECTED NOT DETECTED Final   Enteroaggregative E coli (EAEC) NOT DETECTED NOT DETECTED Final   Enteropathogenic E coli (EPEC) NOT DETECTED NOT DETECTED Final   Enterotoxigenic E coli (ETEC) NOT DETECTED NOT DETECTED Final   Shiga like toxin producing E coli (STEC) NOT DETECTED NOT DETECTED Final   Shigella/Enteroinvasive E coli (EIEC) NOT DETECTED NOT DETECTED Final   Cryptosporidium NOT DETECTED NOT DETECTED Final  Cyclospora cayetanensis NOT DETECTED NOT DETECTED Final   Entamoeba histolytica NOT DETECTED NOT DETECTED Final   Giardia lamblia NOT DETECTED NOT DETECTED Final   Adenovirus F40/41 NOT DETECTED NOT DETECTED Final   Astrovirus NOT DETECTED NOT DETECTED Final   Norovirus GI/GII NOT DETECTED NOT DETECTED Final   Rotavirus A NOT DETECTED NOT DETECTED Final   Sapovirus (I, II, IV, and V) NOT DETECTED NOT DETECTED Final  C difficile quick scan w PCR reflex     Status: None   Collection Time: 02/21/17 12:30 AM  Result Value Ref Range Status   C Diff antigen NEGATIVE NEGATIVE Final   C Diff toxin NEGATIVE NEGATIVE Final   C Diff interpretation No C. difficile detected.  Final  Culture, blood (Routine X 2) w Reflex to ID Panel     Status: None (Preliminary result)   Collection Time: 02/21/17  2:09 PM  Result Value Ref Range Status   Specimen Description   Final    BLOOD RIGHT ANTECUBITAL Performed at [facility], [address] [place], [place] [address]    Special Requests   Final    BLOOD [facility] Performed at [facility], [address] [place], [place] [address]    Culture   Final     NO GROWTH 2 DAYS Performed at [facility], [place] [address], [place], [place] [address]    Report Status PENDING  Incomplete  Culture, blood (Routine X 2) w Reflex to ID Panel     Status: None (Preliminary result)   Collection Time: 02/21/17  2:09 PM  Result Value Ref Range Status   Specimen Description   Final    BLOOD RIGHT ANTECUBITAL Performed at [facility], [address], [place], [place] [address]    Special Requests   Final    BLOOD [facility] Performed at [facility], [address], [place], [place] [address]    Culture   Final    NO GROWTH 2 DAYS Performed at [facility], [place] [address], [place], [place] [address]    Report Status PENDING  Incomplete  Culture, Urine     Status: None (Preliminary result)   Collection Time: 02/21/17  5:04 PM  Result Value Ref Range Status   Specimen Description URINE, CLEAN CATCH  Final   Special Requests NONE  Final   Culture   Final    CULTURE REINCUBATED FOR BETTER GROWTH Performed at [facility], [place] [address] [place], [place] [address]    Report Status PENDING  Incomplete         Radiology Studies: Dg Chest 2 View  Result Date: 02/22/2017 CLINICAL DATA:  Leukocytosis EXAM: CHEST  2 VIEW COMPARISON:  05/01/2014 FINDINGS: Cardiac shadow is within normal limits. The lungs are well aerated bilaterally. No focal infiltrate or sizable effusion is noted. Mild scarring is noted in the right mid lung stable from the prior study. No acute bony abnormality is seen. IMPRESSION: No acute abnormality noted. Electronically Signed   By: Inez Catalina M.D.   On: 02/22/2017 11:17        Scheduled Meds: . amLODipine  5 mg Oral Daily  . aspirin EC  81 mg Oral Daily  . atorvastatin  40 mg Oral QHS  . cefTRIAXone (ROCEPHIN)  IV  1 g Intravenous Q24H  . enoxaparin (LOVENOX) injection  40 mg Subcutaneous Daily  . ezetimibe  10 mg Oral QHS   Continuous Infusions: .  sodium bicarbonate  infusion 1000 mL  100 mL/hr  at 02/23/17 0955     LOS: 2 days    Time spent: 75 minutes    Marcella Charlson, MD [facility] Pager [phone]  If 7PM-7AM, please contact night-coverage [email or web address] Password [facility] 02/23/2017, 12:21 PM

## 2017-02-23 NOTE — Progress Notes (Signed)
Pt complaining of 9 loose stools over the last 2 hours. Imodium given. 1 hour recheck, pt is asleep. Will continue to monitor.  Roselind Rily

## 2017-02-24 LAB — BASIC METABOLIC PANEL
Anion gap: 5 (ref 5–15)
BUN: 12 mg/dL (ref 6–20)
CO2: 22 mmol/L (ref 22–32)
CREATININE: 0.81 mg/dL (ref 0.44–1.00)
Calcium: 8.6 mg/dL — ABNORMAL LOW (ref 8.9–10.3)
Chloride: 111 mmol/L (ref 101–111)
Glucose, Bld: 119 mg/dL — ABNORMAL HIGH (ref 65–99)
POTASSIUM: 4 mmol/L (ref 3.5–5.1)
SODIUM: 138 mmol/L (ref 135–145)

## 2017-02-24 LAB — CBC WITH DIFFERENTIAL/PLATELET
BASOS ABS: 0 10*3/uL (ref 0.0–0.1)
BASOS PCT: 0 %
EOS ABS: 14.5 10*3/uL — AB (ref 0.0–0.7)
Eosinophils Relative: 57 %
HCT: 33.2 % — ABNORMAL LOW (ref 36.0–46.0)
Hemoglobin: 11.5 g/dL — ABNORMAL LOW (ref 12.0–15.0)
Lymphocytes Relative: 10 %
Lymphs Abs: 2.5 10*3/uL (ref 0.7–4.0)
MCH: 31.9 pg (ref 26.0–34.0)
MCHC: 34.6 g/dL (ref 30.0–36.0)
MCV: 92.2 fL (ref 78.0–100.0)
MONO ABS: 1 10*3/uL (ref 0.1–1.0)
Monocytes Relative: 4 %
NEUTROS PCT: 29 %
Neutro Abs: 7.4 10*3/uL (ref 1.7–7.7)
PLATELETS: 239 10*3/uL (ref 150–400)
RBC: 3.6 MIL/uL — ABNORMAL LOW (ref 3.87–5.11)
RDW: 14.3 % (ref 11.5–15.5)
WBC: 25.4 10*3/uL — AB (ref 4.0–10.5)

## 2017-02-24 LAB — MAGNESIUM: MAGNESIUM: 1.5 mg/dL — AB (ref 1.7–2.4)

## 2017-02-24 MED ORDER — MAGNESIUM SULFATE 4 GM/100ML IV SOLN
4.0000 g | Freq: Once | INTRAVENOUS | Status: AC
  Administered 2017-02-24: 4 g via INTRAVENOUS
  Filled 2017-02-24: qty 100

## 2017-02-24 MED ORDER — FUROSEMIDE 10 MG/ML IJ SOLN
20.0000 mg | Freq: Once | INTRAMUSCULAR | Status: DC
  Filled 2017-02-24: qty 2

## 2017-02-24 MED ORDER — LOPERAMIDE HCL 2 MG PO CAPS
4.0000 mg | ORAL_CAPSULE | Freq: Once | ORAL | Status: AC
  Administered 2017-02-24: 4 mg via ORAL
  Filled 2017-02-24: qty 2

## 2017-02-24 MED ORDER — FUROSEMIDE 10 MG/ML IJ SOLN
20.0000 mg | Freq: Two times a day (BID) | INTRAMUSCULAR | Status: DC
  Administered 2017-02-24 – 2017-02-25 (×2): 20 mg via INTRAVENOUS
  Filled 2017-02-24: qty 2

## 2017-02-24 MED ORDER — AMOXICILLIN-POT CLAVULANATE 875-125 MG PO TABS
1.0000 | ORAL_TABLET | Freq: Two times a day (BID) | ORAL | Status: DC
  Administered 2017-02-24 – 2017-02-25 (×3): 1 via ORAL
  Filled 2017-02-24 (×3): qty 1

## 2017-02-24 MED ORDER — SACCHAROMYCES BOULARDII 250 MG PO CAPS
250.0000 mg | ORAL_CAPSULE | Freq: Two times a day (BID) | ORAL | Status: DC
  Administered 2017-02-24 – 2017-02-25 (×3): 250 mg via ORAL
  Filled 2017-02-24 (×3): qty 1

## 2017-02-24 NOTE — Progress Notes (Signed)
RN notified Dr Grandville Silos that patient has bilateral lower extremity +1 edema, edema in her hands, and facial edema.

## 2017-02-24 NOTE — Progress Notes (Signed)
PROGRESS NOTE    Brandy Hawkins  DGL:[id number] DOB: 03-Nov-1929 DOA: 02/20/2017 PCP: Beatris Si   Brief Narrative:  83 y/o female with history of DM2 and hypertension presents with 4 days of diarrhea. She states that she has some abdominal cramping which is relieved after bowel movement.  The patient denies and fevers, chills,  cp, sob, cough,  n/v, hematochezia, melena, dysuria, hematuria.  She denies any recent travels or eating raw or undercooked foods.  She denies any new medications or antibiotics.  She saw her PCP on 02/20/17 and lab work revealed WBC of 30K and patient was sent to the ED for further evaluation.  Upon admission, the patient was afebrile and hemodynamically stable, saturating well on RA.     Assessment & Plan:   Principal Problem:   Diarrhea of presumed infectious origin Active Problems:   AKI (acute kidney injury) ([place])   HTN (hypertension)   Diarrhea   UTI (urinary tract infection)   Leukocytosis  #1 diarrhea likely secondary to viral gastroenteritis Patient with some clinical improvement with improvement with consistency of stool. No further nausea or emesis. CT abdomen and pelvis consistent with a diarrheal illness and no other acute abnormalities. C. difficile PCR negative. Stool pathogen panel negative. Continue supportive care with IV fluids. Antiemetics. Antimotility agents as needed.  #2 strep viridans UTI Urine cultures c/w strep viridans. Change IV Rocephin to oral augmentin. Follow.  #3 leukocytosis Likely secondary to UTI and possibly reactive leukocytosis. CT abdomen and pelvis with no acute abnormalities. C. difficile PCR negative. Stool pathogen panel negative. Urine cultures consistent with strep viridans. Blood cultures pending. Leukocytosis slowly trending down. Change IV Rocephin to oral Augmentin to complete a course of antibiotic treatment. Patient will need outpatient follow-up with repeat CBC in 2 weeks and if no significant improvement may  need referral to hematology in the outpatient setting.   #4 acute kidney injury Likely secondary to a prerenal azotemia in the setting of ACE inhibitor and diuretic. Improved with hydration. ACE inhibitor and diuretic on hold. Follow.  #5 hypomagnesemia Likely secondary to GI losses. Replete.  #6 hypertension ACE inhibitor and HCTZ being held secondary to acute kidney injury. Blood pressure improved on Norvasc 10 mg daily.    #7 diabetes mellitus type 2 Sliding scale insulin.  #8 hyperlipidemia Stable. Continue statin and Zetia.  #9 metabolic acidosis Improved on bicarbonate drip. D/c bicarbonate drip.    DVT prophylaxis: Lovenox Code Status: Full Family Communication: Updated patient, no family at bedside. Disposition Plan: Home when medically stable, urine cultures have resulted, improvement with leukocytosis, hopefully tommorrow   Consultants:   None  Procedures:   CT abd/Pelvis 02/20/2017  CXR 02/22/2017  Antimicrobials:   IV Rocephin 02/22/2017>>>>02/24/2017  IV Cipro 02/21/2017 1 dose  augmentin 02/24/2017   Subjective: Patient states number of loose stools have improved. Patient denies any chest pain. No shortness of breath. Patient states she's starting to feel better.   Objective: Vitals:   02/23/17 2036 02/23/17 2115 02/24/17 0558 02/24/17 1451  BP: (!) 180/71 (!) 178/82 (!) 128/59 (!) 158/67  Pulse: (!) 121  90 85  Resp: 18  18 18   Temp: 97.8 F (36.6 C)  98.4 F (36.9 C) 98.1 F (36.7 C)  TempSrc: Axillary  Oral Oral  SpO2: 99%  90% 98%  Weight:    63 kg (138 lb 14.2 oz)  Height:        Intake/Output Summary (Last 24 hours) at 02/24/17 1516 Last data filed at 02/24/17  1452  Gross per 24 hour  Intake             1440 ml  Output             1300 ml  Net              140 ml   Filed Weights   02/20/17 1753 02/21/17 0120 02/24/17 1451  Weight: 54.1 kg (119 lb 3 oz) 55.2 kg (121 lb 11.1 oz) 63 kg (138 lb 14.2 oz)     Examination:  General exam: Appears calm and comfortable  Respiratory system: Clear to auscultation. Respiratory effort normal. Cardiovascular system: S1 & S2 heard, RRR. No JVD, murmurs, rubs, gallops or clicks. No pedal edema. Gastrointestinal system: Abdomen is nondistended, soft and nontender. No organomegaly or masses felt. Normal bowel sounds heard. Central nervous system: Alert and oriented. No focal neurological deficits. Extremities: Symmetric 5 x 5 power. Skin: No rashes, lesions or ulcers Psychiatry: Judgement and insight appear normal. Mood & affect appropriate.     Data Reviewed: I have personally reviewed following labs and imaging studies  CBC:  Recent Labs Lab 02/20/17 1832 02/21/17 0524 02/22/17 0521 02/23/17 0449 02/24/17 0420  WBC 32.1* 32.9* 29.7* 26.9* 25.4*  NEUTROABS  --   --  8.3* 8.1* 7.4  HGB 14.1 13.2 12.0 10.8* 11.5*  HCT 42.3 38.6 35.1* 31.4* 33.2*  MCV 91.4 90.8 92.9 92.6 92.2  PLT 294 268 236 223 650   Basic Metabolic Panel:  Recent Labs Lab 02/20/17 1832 02/21/17 0524 02/22/17 0521 02/23/17 0449 02/24/17 0420  NA 132* 131* 140 137 138  K 4.3 3.6 4.2 4.1 4.0  CL 100* 105 118* 114* 111  CO2 22 22 18* 16* 22  GLUCOSE 128* 95 106* 120* 119*  BUN 46* 36* 22* 20 12  CREATININE 1.99* 1.30* 0.97 0.84 0.81  CALCIUM 10.3 8.7* 8.8* 8.6* 8.6*  MG  --   --  1.3* 2.0 1.5*   GFR: Estimated Creatinine Clearance: 44.5 mL/min (by C-G formula based on SCr of 0.81 mg/dL). Liver Function Tests:  Recent Labs Lab 02/20/17 1832  AST 24  ALT 23  ALKPHOS 99  BILITOT 0.9  PROT 8.2*  ALBUMIN 4.3    Recent Labs Lab 02/20/17 1832  LIPASE 77*   No results for input(s): AMMONIA in the last 168 hours. Coagulation Profile: No results for input(s): INR, PROTIME in the last 168 hours. Cardiac Enzymes: No results for input(s): CKTOTAL, CKMB, CKMBINDEX, TROPONINI in the last 168 hours. BNP (last 3 results) No results for input(s): PROBNP in  the last 8760 hours. HbA1C:  Recent Labs  02/22/17 0521  HGBA1C 6.4*   CBG: No results for input(s): GLUCAP in the last 168 hours. Lipid Profile: No results for input(s): CHOL, HDL, LDLCALC, TRIG, CHOLHDL, LDLDIRECT in the last 72 hours. Thyroid Function Tests: No results for input(s): TSH, T4TOTAL, FREET4, T3FREE, THYROIDAB in the last 72 hours. Anemia Panel:  Recent Labs  02/23/17 0820  VITAMINB12 1,366*  FOLATE 28.0  FERRITIN 59  TIBC 258  IRON 36   Sepsis Labs: No results for input(s): PROCALCITON, LATICACIDVEN in the last 168 hours.  Recent Results (from the past 240 hour(s))  Gastrointestinal Panel by PCR , Stool     Status: None   Collection Time: 02/21/17 12:30 AM  Result Value Ref Range Status   Campylobacter species NOT DETECTED NOT DETECTED Final   Plesimonas shigelloides NOT DETECTED NOT DETECTED Final   Salmonella species NOT DETECTED  NOT DETECTED Final   Yersinia enterocolitica NOT DETECTED NOT DETECTED Final   Vibrio species NOT DETECTED NOT DETECTED Final   Vibrio cholerae NOT DETECTED NOT DETECTED Final   Enteroaggregative E coli (EAEC) NOT DETECTED NOT DETECTED Final   Enteropathogenic E coli (EPEC) NOT DETECTED NOT DETECTED Final   Enterotoxigenic E coli (ETEC) NOT DETECTED NOT DETECTED Final   Shiga like toxin producing E coli (STEC) NOT DETECTED NOT DETECTED Final   Shigella/Enteroinvasive E coli (EIEC) NOT DETECTED NOT DETECTED Final   Cryptosporidium NOT DETECTED NOT DETECTED Final   Cyclospora cayetanensis NOT DETECTED NOT DETECTED Final   Entamoeba histolytica NOT DETECTED NOT DETECTED Final   Giardia lamblia NOT DETECTED NOT DETECTED Final   Adenovirus F40/41 NOT DETECTED NOT DETECTED Final   Astrovirus NOT DETECTED NOT DETECTED Final   Norovirus GI/GII NOT DETECTED NOT DETECTED Final   Rotavirus A NOT DETECTED NOT DETECTED Final   Sapovirus (I, II, IV, and V) NOT DETECTED NOT DETECTED Final  C difficile quick scan w PCR reflex     Status:  None   Collection Time: 02/21/17 12:30 AM  Result Value Ref Range Status   C Diff antigen NEGATIVE NEGATIVE Final   C Diff toxin NEGATIVE NEGATIVE Final   C Diff interpretation No C. difficile detected.  Final  Culture, blood (Routine X 2) w Reflex to ID Panel     Status: None (Preliminary result)   Collection Time: 02/21/17  2:09 PM  Result Value Ref Range Status   Specimen Description   Final    BLOOD RIGHT ANTECUBITAL Performed at [facility], [address] [place], [place] [address]    Special Requests   Final    BLOOD [facility] Performed at [facility], [address] [place], [place] [address]    Culture   Final    NO GROWTH 3 DAYS Performed at [facility], [place] [address], [place], [place] [address]    Report Status PENDING  Incomplete  Culture, blood (Routine X 2) w Reflex to ID Panel     Status: None (Preliminary result)   Collection Time: 02/21/17  2:09 PM  Result Value Ref Range Status   Specimen Description   Final    BLOOD RIGHT ANTECUBITAL Performed at [facility], [address], [place], [place] [address]    Special Requests   Final    BLOOD [facility] Performed at [facility], [address], [place], [place] [address]    Culture   Final    NO GROWTH 3 DAYS Performed at [facility], [place] [address] [place], [place] [address]    Report Status PENDING  Incomplete  Culture, Urine     Status: Abnormal   Collection Time: 02/21/17  5:04 PM  Result Value Ref Range Status   Specimen Description URINE, CLEAN CATCH  Final   Special Requests NONE  Final   Culture (A)  Final    >=100,000 COLONIES/mL VIRIDANS STREPTOCOCCUS CALL MICROBIOLOGY LAB IF SENSITIVITIES ARE REQUIRED. Performed at [facility], [place] [address], [place], [place] [address]    Report Status 02/23/2017 FINAL  Final         Radiology Studies: No results found.      Scheduled Meds: . amLODipine  10 mg Oral Daily  . amoxicillin-clavulanate  1 tablet Oral  Q12H  . aspirin EC  81 mg Oral Daily  . atorvastatin  40 mg Oral QHS  . ezetimibe  10 mg Oral QHS  .  furosemide  20 mg Intravenous Once   Continuous Infusions:    LOS: 3 days    Time spent: 35 minutes    Gianpaolo Mindel, MD [facility] Pager [phone]  If 7PM-7AM, please contact night-coverage [email or web address] Password [facility] 02/24/2017, 3:16 PM

## 2017-02-24 NOTE — Progress Notes (Signed)
Pt had nose bleed.  Pt states that she had one yesterday afternoon too.  Pt applied pressure to nose, instructed not to blow and assisted back to bed w/ HOB elevated

## 2017-02-25 DIAGNOSIS — E1169 Type 2 diabetes mellitus with other specified complication: Secondary | ICD-10-CM

## 2017-02-25 DIAGNOSIS — E785 Hyperlipidemia, unspecified: Secondary | ICD-10-CM

## 2017-02-25 DIAGNOSIS — N179 Acute kidney failure, unspecified: Secondary | ICD-10-CM

## 2017-02-25 LAB — BASIC METABOLIC PANEL WITH GFR
Anion gap: 4 — ABNORMAL LOW (ref 5–15)
BUN: 17 mg/dL (ref 6–20)
CO2: 25 mmol/L (ref 22–32)
Calcium: 9.2 mg/dL (ref 8.9–10.3)
Chloride: 107 mmol/L (ref 101–111)
Creatinine, Ser: 0.91 mg/dL (ref 0.44–1.00)
GFR calc Af Amer: >60 mL/min (ref >60)
GFR calc non Af Amer: 56 mL/min — ABNORMAL LOW (ref >60)
Glucose, Bld: 118 mg/dL — ABNORMAL HIGH (ref 65–99)
Potassium: 4.1 mmol/L (ref 3.5–5.1)
Sodium: 136 mmol/L (ref 135–145)

## 2017-02-25 LAB — CBC WITH DIFFERENTIAL/PLATELET
BASOS ABS: 0.2 10*3/uL — AB (ref 0.0–0.1)
Basophils Relative: 1 %
EOS ABS: 12.3 10*3/uL — AB (ref 0.0–0.7)
Eosinophils Relative: 56 %
HCT: 37.4 % (ref 36.0–46.0)
HEMOGLOBIN: 12.9 g/dL (ref 12.0–15.0)
LYMPHS ABS: 1.5 10*3/uL (ref 0.7–4.0)
Lymphocytes Relative: 7 %
MCH: 31.9 pg (ref 26.0–34.0)
MCHC: 34.5 g/dL (ref 30.0–36.0)
MCV: 92.6 fL (ref 78.0–100.0)
Monocytes Absolute: 1.1 10*3/uL — ABNORMAL HIGH (ref 0.1–1.0)
Monocytes Relative: 5 %
NEUTROS PCT: 31 %
Neutro Abs: 6.8 10*3/uL (ref 1.7–7.7)
PLATELETS: 226 10*3/uL (ref 150–400)
RBC: 4.04 MIL/uL (ref 3.87–5.11)
RDW: 14.5 % (ref 11.5–15.5)
WBC: 21.9 10*3/uL — AB (ref 4.0–10.5)

## 2017-02-25 LAB — MAGNESIUM: Magnesium: 2 mg/dL (ref 1.7–2.4)

## 2017-02-25 MED ORDER — SACCHAROMYCES BOULARDII 250 MG PO CAPS: 250.0000 mg | ORAL_CAPSULE | Freq: Two times a day (BID) | ORAL | Status: AC

## 2017-02-25 MED ORDER — AMLODIPINE BESYLATE 10 MG PO TABS: 10.0000 mg | ORAL_TABLET | Freq: Every day | ORAL | 1 refills | Status: AC

## 2017-02-25 MED ORDER — AMOXICILLIN-POT CLAVULANATE 875-125 MG PO TABS: 1.0000 | ORAL_TABLET | Freq: Two times a day (BID) | ORAL | 0 refills | Status: AC

## 2017-02-25 NOTE — Discharge Summary (Signed)
Physician Discharge Summary  Brandy Hawkins UJW:[id number] DOB: [birth year] DOA: 02/20/2017  PCP: Brandy Hawkins  Admit date: 02/20/2017 Discharge date: 02/25/2017  Time spent: 60 minutes  Recommendations for Outpatient Follow-up:  1. Follow-up with Hawkins,MARK, PA-C in 1-2 weeks. On follow-up patient will need a CBC with differential done to follow-up on leukocytosis. If leukocytosis does not continue to trend down and worsening patient will likely need a referral to outpatient hematology for further evaluation. Patient also need a basic metabolic profile done to follow-up on a ledge lites and renal function. Patient's blood pressure need to be reassessed as patient's ACE inhibitor and diuretic were discontinued secondary to presentation of acute renal failure. Patient was discharged on Norvasc 10 mg daily. 2.    Discharge Diagnoses:  Principal Problem:   Diarrhea Active Problems:   S/P right THA, AA   AKI (acute kidney injury) ([place])   HTN (hypertension)   UTI (urinary tract infection)   Leukocytosis   Discharge Condition: Stable and improved  Diet recommendation: Carb modified  Filed Weights   02/20/17 1753 02/21/17 0120 02/24/17 1451  Weight: 54.1 kg (119 lb 3 oz) 55.2 kg (121 lb 11.1 oz) 63 kg (138 lb 14.2 oz)    History of present illness:  Per Dr Brandy Hawkins is a 83 y.o. female with medical history significant of HTN.  Patient presents to the ED with c/o Diarrhea and abdominal discomfort for the past 4 days.  No fever nor chills, no N/V.  Patient notes that BP has been running very low for her at home over the past 4 days (56O systolic).  Patient went to doctor today, and outpatient blood work showed leukocytosis of 30k as well as AKI so patient was sent in to ED.  ED Course: BPs running 130Q systolic, and now 657Q systolic here in ED.  Hospital Course:  #1 diarrhea likely secondary to viral gastroenteritis Patient Had presented with a diarrheal illness with  concerns for an infectious etiology. Patient was admitted placed on IV fluids supportive care. CT abdomen and pelvis consistent with a diarrheal illness and no other acute abnormalities. C. difficile PCR negative. Stool pathogen panel negative. Patient was placed on antiemetics IV fluids and anti-motility agents. Patient improved clinically. Consistency of patient's stools improved. Patient be discharged home in stable and improved condition. Monitor closely. Outpatient follow-up.   #2 strep viridans UTI Urine cultures c/w strep viridans. Patient was placed empirically on IV Rocephin subsequently transitioned to oral Augmentin. Patient be discharged on 2 more days of oral Augmentin to complete a course of antibiotic treatment.  #3 leukocytosis Likely secondary to UTI and reactive leukocytosis. CT abdomen and pelvis with no acute abnormalities. C. difficile PCR negative. Stool pathogen panel negative. Urine cultures consistent with strep viridans. Blood cultures with no growth to date. Patient was placed empirically on IV Rocephin and subsequently transitioned to oral Augmentin. Leukocytosis slowly trended down. Patient's white count on admission was 32.9 and slowly trended down such that by discharge white count was down to 21.9. Patient remained afebrile. Changed IV Rocephin to oral Augmentin to complete a course of antibiotic treatment. Patient will need outpatient follow-up with repeat CBC in 2 weeks and if no significant improvement may need referral to hematology in the outpatient setting.   #4 acute kidney injury/acute renal failure Likely secondary to a prerenal azotemia in the setting of ACE inhibitor and diuretic. Patient's ACE inhibitor and diuretic were held during the hospitalization and patient hydrated with IV fluids. Patient's  renal function improved and was back to baseline by day of discharge. Patient states and diuretic were discontinued on discharge. Outpatient follow-up.  #5  hypomagnesemia Likely secondary to GI losses. Repleted.  #6 hypertension ACE inhibitor and HCTZ being held secondary to acute kidney injury/ARF. Patient's creatinine was 1.99 on admission. Patient was placed on Norvasc 10 mg daily. Outpatient follow-up.   #7 diabetes mellitus type 2 Patient was maintained on Sliding scale insulin.  #8 hyperlipidemia Stable. Continued on home regimen of statin and Zetia.  #9 metabolic acidosis Resolved on bicarbonate drip.   Procedures:  CT abd/Pelvis 02/20/2017  CXR 02/22/2017   Consultations:  None  Discharge Exam: Vitals:   02/25/17 0615 02/25/17 0912  BP: (!) 140/52 (!) 165/62  Pulse: 78   Resp: 18   Temp: 98.7 F (37.1 C)     General: NAD Cardiovascular: RRR Respiratory: CTAB  Discharge Instructions   Discharge Instructions    Diet Carb Modified    Complete by:  As directed    Increase activity slowly    Complete by:  As directed      Current Discharge Medication List    START taking these medications   Details  amLODipine (NORVASC) 10 MG tablet Take 1 tablet (10 mg total) by mouth daily. Qty: 30 tablet, Refills: 1    amoxicillin-clavulanate (AUGMENTIN) 875-125 MG tablet Take 1 tablet by mouth every 12 (twelve) hours. Take for 2 days then stop. Qty: 4 tablet, Refills: 0    saccharomyces boulardii (FLORASTOR) 250 MG capsule Take 1 capsule (250 mg total) by mouth 2 (two) times daily.      CONTINUE these medications which have NOT CHANGED   Details  aspirin EC 81 MG tablet Take 81 mg by mouth daily.    atorvastatin (LIPITOR) 40 MG tablet Take 40 mg by mouth at bedtime.     Cholecalciferol (CVS D3 PO) Take 1 tablet by mouth daily.    Cyanocobalamin (B-12 PO) Take 1 tablet by mouth daily.    ezetimibe (ZETIA) 10 MG tablet Take 10 mg by mouth at bedtime.     loperamide (IMODIUM A-D) 2 MG tablet Take 2-4 mg by mouth 4 (four) times daily as needed for diarrhea or loose stools.    metFORMIN (GLUCOPHAGE) 500  MG tablet Take 500 mg by mouth 2 (two) times daily with a meal.      STOP taking these medications     metroNIDAZOLE (FLAGYL) 500 MG tablet      olmesartan-hydrochlorothiazide (BENICAR HCT) 40-25 MG per tablet      ferrous sulfate 325 (65 FE) MG tablet        No Known Allergies Follow-up Information    Hawkins,MARK, PA-C. Schedule an appointment as soon as possible for a visit in 2 week(s).   Specialty:  Physician Assistant Why:  f/u in 1-2 weeks. Contact information: [address] [place]  [address] [phone]            The results of significant diagnostics from this hospitalization (including imaging, microbiology, ancillary and laboratory) are listed below for reference.    Significant Diagnostic Studies: Ct Abdomen Pelvis Wo Contrast  Result Date: 02/20/2017 CLINICAL DATA:  Diarrhea and abdominal pain EXAM: CT ABDOMEN AND PELVIS WITHOUT CONTRAST TECHNIQUE: Multidetector CT imaging of the abdomen and pelvis was performed following the standard protocol without IV contrast. COMPARISON:  None. FINDINGS: Lower chest: There is an area of consolidation within the lower aspect of the right upper lobe, incompletely visualized.  There is bibasilar dependent atelectasis. There are coronary artery and cardiac valvular calcifications. Hepatobiliary: Normal hepatic size and contours. No perihepatic ascites. No intra- or extrahepatic biliary dilatation. The gallbladder is dilated without other focal abnormality. Pancreas: Normal pancreatic contours. No peripancreatic fluid collection or pancreatic ductal dilatation. Spleen: Normal. Adrenals/Urinary Tract: Normal adrenal glands. Intermediate attenuation lesion of the left renal pelvis is likely a hemorrhagic cyst. Multiple smaller but similar lesions are seen in the right kidney. Kidneys are otherwise unremarkable. Stomach/Bowel: The colon is largely fluid-filled. There is no small bowel dilatation. No evidence of acute  inflammation. Normal appendix. Vascular/Lymphatic: There is atherosclerotic calcification of the non aneurysmal abdominal aorta. No abdominal or pelvic adenopathy. Reproductive: Calcified fibroid at the uterine fundus. No adnexal mass. Musculoskeletal: Status post right total hip arthroplasty. Multilevel lumbar osteophytosis and facet arthrosis. No bony spinal canal narrowing. No lytic or blastic lesions. Normal visualized extrathoracic and extraperitoneal soft tissues. Other: No contributory non-categorized findings. IMPRESSION: 1. Fluid-filled colon and small bowel is consistent with acute diarrheal illness. No evidence of active inflammation or obstruction. 2. Incompletely visualized area of consolidation within the lower aspect of the right upper lobe. This may indicate subsegmental atelectasis, an area of infection or neoplastic process. At a minimum, follow-up scan of the chest in 4-6 weeks is recommended to ensure resolution and to confirm that there is no associated mass. 3. Aortic and coronary artery calcific atherosclerosis. 4. Dilated gallbladder without other evidence of acute cholecystitis. 5. Intermediate attenuation lesion of the left renal pelvis is statistically most likely to be a hemorrhagic cyst, but is incompletely evaluated on this study. Correlation with abdominal MRI with and without contrast or renal mass protocol CT is recommended on a outpatient basis. Multiple smaller lesions of the same densities are also seen within the right kidney. Electronically Signed   By: Ulyses Jarred M.D.   On: 02/20/2017 22:44   Dg Chest 2 View  Result Date: 02/22/2017 CLINICAL DATA:  Leukocytosis EXAM: CHEST  2 VIEW COMPARISON:  05/01/2014 FINDINGS: Cardiac shadow is within normal limits. The lungs are well aerated bilaterally. No focal infiltrate or sizable effusion is noted. Mild scarring is noted in the right mid lung stable from the prior study. No acute bony abnormality is seen. IMPRESSION: No acute  abnormality noted. Electronically Signed   By: Inez Catalina M.D.   On: 02/22/2017 11:17    Microbiology: Recent Results (from the past 240 hour(s))  Gastrointestinal Panel by PCR , Stool     Status: None   Collection Time: 02/21/17 12:30 AM  Result Value Ref Range Status   Campylobacter species NOT DETECTED NOT DETECTED Final   Plesimonas shigelloides NOT DETECTED NOT DETECTED Final   Salmonella species NOT DETECTED NOT DETECTED Final   Yersinia enterocolitica NOT DETECTED NOT DETECTED Final   Vibrio species NOT DETECTED NOT DETECTED Final   Vibrio cholerae NOT DETECTED NOT DETECTED Final   Enteroaggregative E coli (EAEC) NOT DETECTED NOT DETECTED Final   Enteropathogenic E coli (EPEC) NOT DETECTED NOT DETECTED Final   Enterotoxigenic E coli (ETEC) NOT DETECTED NOT DETECTED Final   Shiga like toxin producing E coli (STEC) NOT DETECTED NOT DETECTED Final   Shigella/Enteroinvasive E coli (EIEC) NOT DETECTED NOT DETECTED Final   Cryptosporidium NOT DETECTED NOT DETECTED Final   Cyclospora cayetanensis NOT DETECTED NOT DETECTED Final   Entamoeba histolytica NOT DETECTED NOT DETECTED Final   Giardia lamblia NOT DETECTED NOT DETECTED Final   Adenovirus F40/41 NOT DETECTED NOT DETECTED Final  Astrovirus NOT DETECTED NOT DETECTED Final   Norovirus GI/GII NOT DETECTED NOT DETECTED Final   Rotavirus A NOT DETECTED NOT DETECTED Final   Sapovirus (I, II, IV, and V) NOT DETECTED NOT DETECTED Final  C difficile quick scan w PCR reflex     Status: None   Collection Time: 02/21/17 12:30 AM  Result Value Ref Range Status   C Diff antigen NEGATIVE NEGATIVE Final   C Diff toxin NEGATIVE NEGATIVE Final   C Diff interpretation No C. difficile detected.  Final  Culture, blood (Routine X 2) w Reflex to ID Panel     Status: None (Preliminary result)   Collection Time: 02/21/17  2:09 PM  Result Value Ref Range Status   Specimen Description   Final    BLOOD RIGHT ANTECUBITAL Performed at [facility], [address], [place], [place] [address]    Special Requests   Final    BLOOD [facility] Performed at [facility], [address], [place], [place] [address]    Culture   Final    NO GROWTH 4 DAYS Performed at [facility], [address], [place], [place] [address]    Report Status PENDING  Incomplete  Culture, blood (Routine X 2) w Reflex to ID Panel     Status: None (Preliminary result)   Collection Time: 02/21/17  2:09 PM  Result Value Ref Range Status   Specimen Description   Final    BLOOD RIGHT ANTECUBITAL Performed at [facility], [address] [place], [place] [address]    Special Requests   Final    BLOOD [facility] Performed at [facility], [address], [place], [place] [address]    Culture   Final    NO GROWTH 4 DAYS Performed at [facility], [place] [address], [place], [place] [address]    Report Status PENDING  Incomplete  Culture, Urine     Status: Abnormal   Collection Time: 02/21/17  5:04 PM  Result Value Ref Range Status   Specimen Description URINE, CLEAN CATCH  Final   Special Requests NONE  Final   Culture (A)  Final    >=100,000 COLONIES/mL VIRIDANS STREPTOCOCCUS CALL MICROBIOLOGY LAB IF SENSITIVITIES ARE REQUIRED. Performed at [facility], [place] [address], [place], [place] [address]    Report Status 02/23/2017 FINAL  Final     Labs: Basic Metabolic Panel:  Recent Labs Lab 02/21/17 0524 02/22/17 0521 02/23/17 0449 02/24/17 0420 02/25/17 0507  NA 131* 140 137 138 136  K 3.6 4.2 4.1 4.0 4.1  CL 105 118* 114* 111 107  CO2 22 18* 16* 22 25  GLUCOSE 95 106* 120* 119* 118*  BUN 36* 22* 20 12 17   CREATININE 1.30* 0.97 0.84 0.81 0.91  CALCIUM 8.7* 8.8* 8.6* 8.6* 9.2  MG  --  1.3* 2.0 1.5* 2.0   Liver Function Tests:  Recent Labs Lab 02/20/17 1832  AST 24  ALT 23  ALKPHOS 99  BILITOT 0.9  PROT 8.2*  ALBUMIN 4.3    Recent Labs Lab 02/20/17 1832  LIPASE 77*   No results for input(s): AMMONIA in  the last 168 hours. CBC:  Recent Labs Lab 02/21/17 0524 02/22/17 0521 02/23/17 0449 02/24/17 0420 02/25/17 0507  WBC 32.9* 29.7* 26.9* 25.4* 21.9*  NEUTROABS  --  8.3* 8.1* 7.4 6.8  HGB 13.2 12.0 10.8* 11.5* 12.9  HCT 38.6 35.1* 31.4* 33.2* 37.4  MCV 90.8 92.9 92.6 92.2 92.6  PLT 268  236 223 239 226   Cardiac Enzymes: No results for input(s): CKTOTAL, CKMB, CKMBINDEX, TROPONINI in the last 168 hours. BNP: BNP (last 3 results) No results for input(s): BNP in the last 8760 hours.  ProBNP (last 3 results) No results for input(s): PROBNP in the last 8760 hours.  CBG: No results for input(s): GLUCAP in the last 168 hours.     SignedIrine Seal MD.  [facility] 02/25/2017, 12:33 PM

## 2017-02-25 NOTE — Progress Notes (Signed)
Pt's vitals are WNL, tolerating diet and pain is under control. Discussed discharge instructions with patient. All questions and concerns addressed. Discharged to home with prescriptions.

## 2017-02-26 LAB — CULTURE, BLOOD (ROUTINE X 2)
CULTURE: NO GROWTH
CULTURE: NO GROWTH

## 2017-03-02 DIAGNOSIS — R197 Diarrhea, unspecified: Secondary | ICD-10-CM | POA: Diagnosis not present

## 2017-03-02 DIAGNOSIS — N179 Acute kidney failure, unspecified: Secondary | ICD-10-CM | POA: Diagnosis not present

## 2017-03-02 DIAGNOSIS — D72829 Elevated white blood cell count, unspecified: Secondary | ICD-10-CM | POA: Diagnosis not present

## 2017-03-13 DIAGNOSIS — D72829 Elevated white blood cell count, unspecified: Secondary | ICD-10-CM | POA: Diagnosis not present

## 2017-03-13 DIAGNOSIS — I10 Essential (primary) hypertension: Secondary | ICD-10-CM | POA: Diagnosis not present

## 2017-03-21 DIAGNOSIS — I10 Essential (primary) hypertension: Secondary | ICD-10-CM | POA: Diagnosis not present

## 2017-05-24 DIAGNOSIS — J320 Chronic maxillary sinusitis: Secondary | ICD-10-CM | POA: Diagnosis not present

## 2017-05-24 DIAGNOSIS — H903 Sensorineural hearing loss, bilateral: Secondary | ICD-10-CM | POA: Diagnosis not present

## 2017-05-24 DIAGNOSIS — J342 Deviated nasal septum: Secondary | ICD-10-CM | POA: Diagnosis not present

## 2017-05-24 DIAGNOSIS — H6121 Impacted cerumen, right ear: Secondary | ICD-10-CM | POA: Diagnosis not present

## 2017-05-24 DIAGNOSIS — J322 Chronic ethmoidal sinusitis: Secondary | ICD-10-CM | POA: Diagnosis not present

## 2017-05-24 DIAGNOSIS — H6061 Unspecified chronic otitis externa, right ear: Secondary | ICD-10-CM | POA: Diagnosis not present

## 2017-05-30 DIAGNOSIS — J320 Chronic maxillary sinusitis: Secondary | ICD-10-CM | POA: Diagnosis not present

## 2017-05-30 DIAGNOSIS — J322 Chronic ethmoidal sinusitis: Secondary | ICD-10-CM | POA: Diagnosis not present

## 2017-06-26 DIAGNOSIS — I10 Essential (primary) hypertension: Secondary | ICD-10-CM | POA: Diagnosis not present

## 2017-06-26 DIAGNOSIS — E1151 Type 2 diabetes mellitus with diabetic peripheral angiopathy without gangrene: Secondary | ICD-10-CM | POA: Diagnosis not present

## 2017-06-28 DIAGNOSIS — Z0000 Encounter for general adult medical examination without abnormal findings: Secondary | ICD-10-CM | POA: Diagnosis not present

## 2017-06-28 DIAGNOSIS — E1151 Type 2 diabetes mellitus with diabetic peripheral angiopathy without gangrene: Secondary | ICD-10-CM | POA: Diagnosis not present

## 2017-06-28 DIAGNOSIS — Z23 Encounter for immunization: Secondary | ICD-10-CM | POA: Diagnosis not present

## 2017-06-28 DIAGNOSIS — I10 Essential (primary) hypertension: Secondary | ICD-10-CM | POA: Diagnosis not present

## 2017-06-28 DIAGNOSIS — E785 Hyperlipidemia, unspecified: Secondary | ICD-10-CM | POA: Diagnosis not present

## 2017-09-12 DIAGNOSIS — Z23 Encounter for immunization: Secondary | ICD-10-CM | POA: Diagnosis not present

## 2017-10-19 DIAGNOSIS — H40013 Open angle with borderline findings, low risk, bilateral: Secondary | ICD-10-CM | POA: Diagnosis not present

## 2017-10-19 DIAGNOSIS — E119 Type 2 diabetes mellitus without complications: Secondary | ICD-10-CM | POA: Diagnosis not present

## 2017-12-25 DIAGNOSIS — E785 Hyperlipidemia, unspecified: Secondary | ICD-10-CM | POA: Diagnosis not present

## 2017-12-25 DIAGNOSIS — I10 Essential (primary) hypertension: Secondary | ICD-10-CM | POA: Diagnosis not present

## 2017-12-25 DIAGNOSIS — E1151 Type 2 diabetes mellitus with diabetic peripheral angiopathy without gangrene: Secondary | ICD-10-CM | POA: Diagnosis not present

## 2017-12-29 DIAGNOSIS — I10 Essential (primary) hypertension: Secondary | ICD-10-CM | POA: Diagnosis not present

## 2017-12-29 DIAGNOSIS — E1151 Type 2 diabetes mellitus with diabetic peripheral angiopathy without gangrene: Secondary | ICD-10-CM | POA: Diagnosis not present

## 2017-12-29 DIAGNOSIS — E785 Hyperlipidemia, unspecified: Secondary | ICD-10-CM | POA: Diagnosis not present

## 2018-01-29 DIAGNOSIS — M79672 Pain in left foot: Secondary | ICD-10-CM | POA: Diagnosis not present

## 2018-05-30 DIAGNOSIS — Z96641 Presence of right artificial hip joint: Secondary | ICD-10-CM | POA: Diagnosis not present

## 2018-05-30 IMAGING — CT CT ABD-PELV W/O CM
2 of 4 series · 15 of 46 positions shown, 17 images · non-contrast
Comparison: None.

CLINICAL DATA: Diarrhea and abdominal pain

EXAM:
CT ABDOMEN AND PELVIS WITHOUT CONTRAST
TECHNIQUE: Multidetector CT imaging of the abdomen and pelvis was performed
following the standard protocol without IV contrast.

[Series 2: abd/pel w/o · axial · non-contrast · 0.74mm/px · z∈[-110,+284]mm · 12 of 89 slices shown, 14 images]
[im 5/89  soft-tissue]
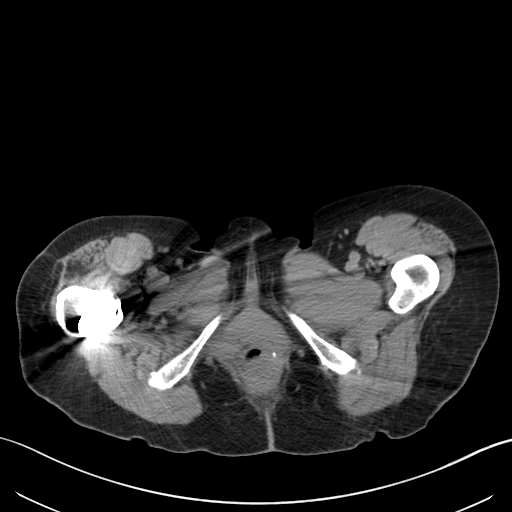
[im 5/89  bone]
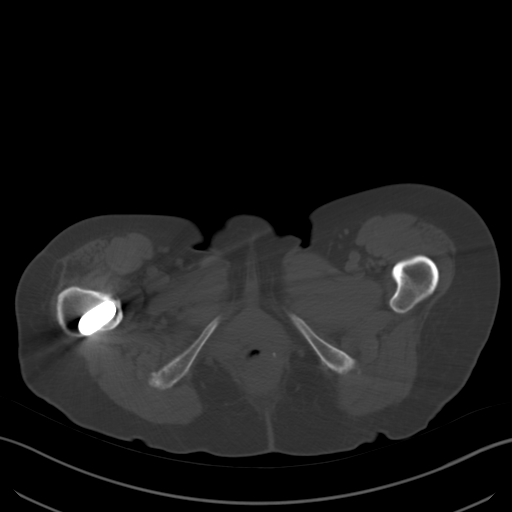
[im 14/89  soft-tissue]
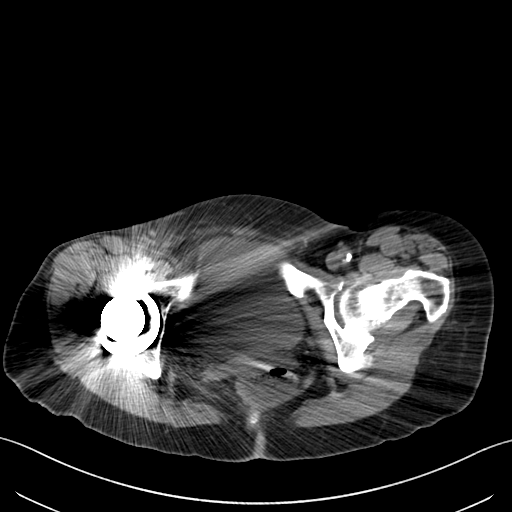
[im 19/89  soft-tissue]
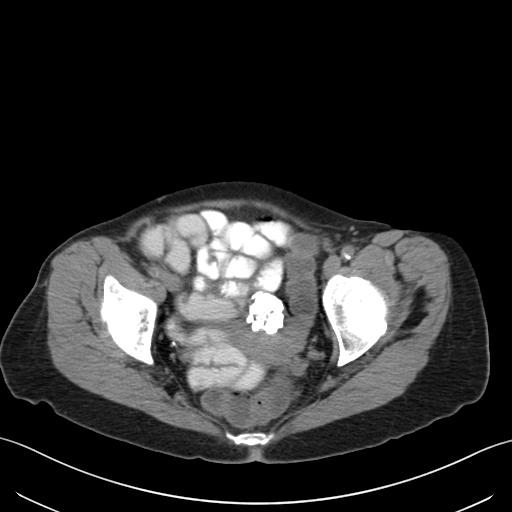
[im 28/89  soft-tissue]
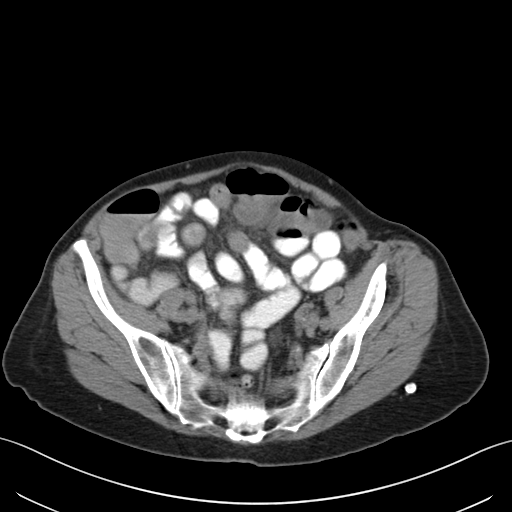
[im 33/89  soft-tissue]
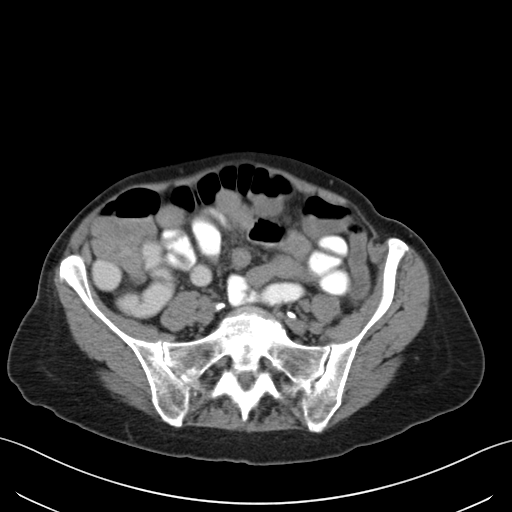
[im 42/89  soft-tissue]
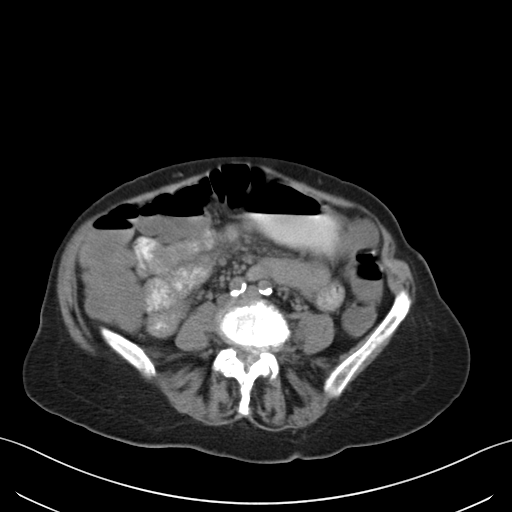
[im 47/89  soft-tissue]
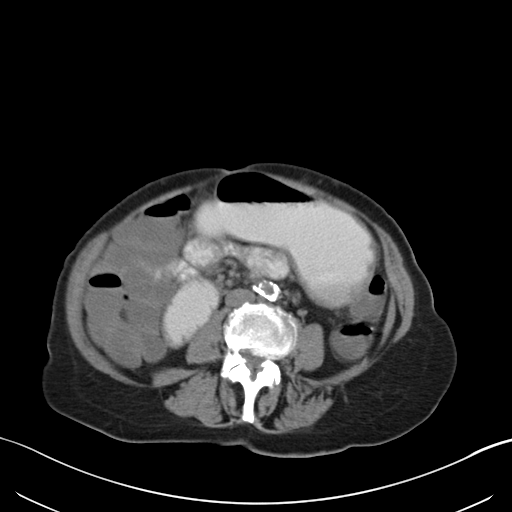
[im 56/89  soft-tissue]
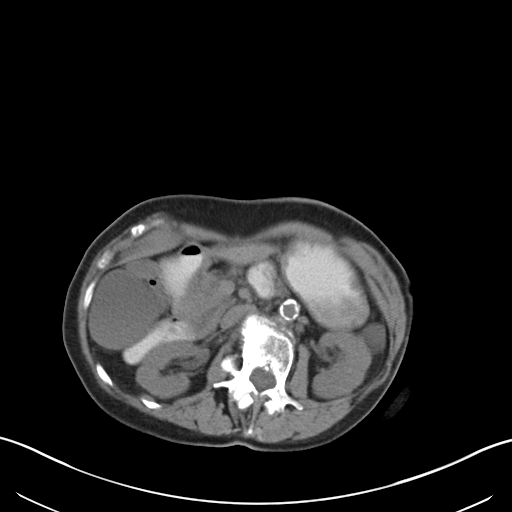
[im 61/89  soft-tissue]
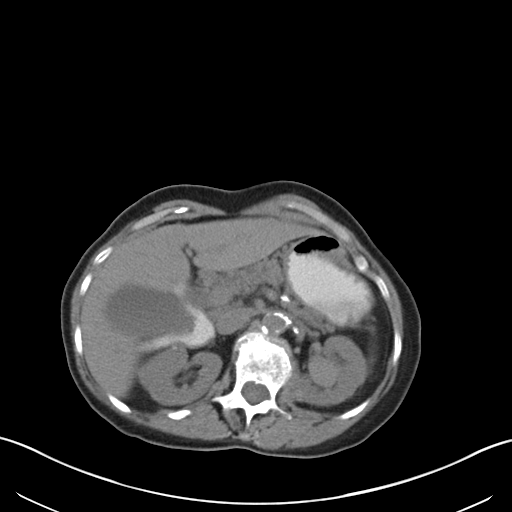
[im 61/89  bone]
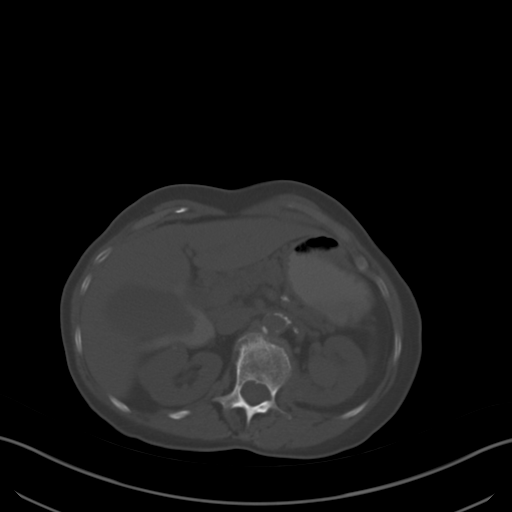
[im 70/89  soft-tissue]
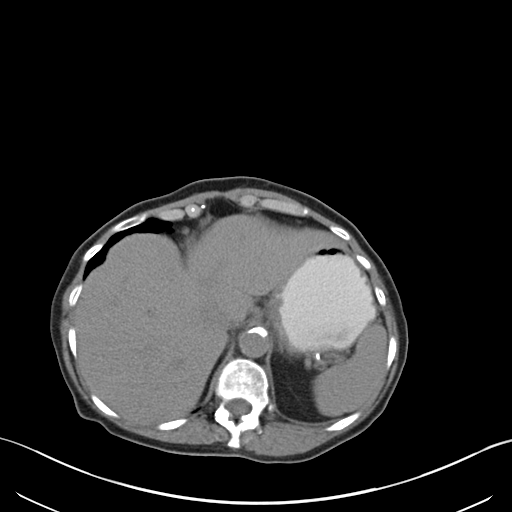
[im 75/89  soft-tissue]
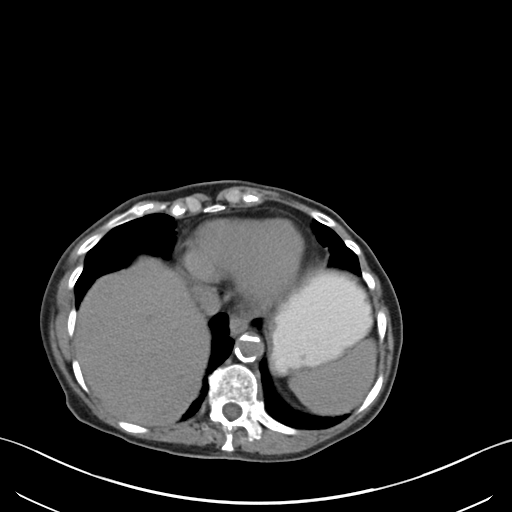
[im 84/89  soft-tissue]
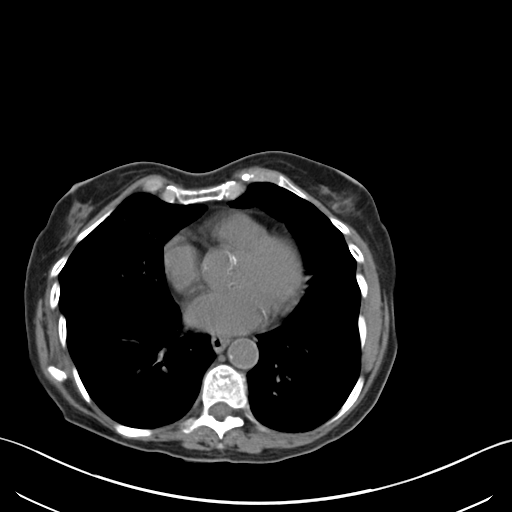

[Series 3: coronal · coronal · 0.74mm/px · 3 of 143 slices shown]
[im 48/143  soft-tissue]
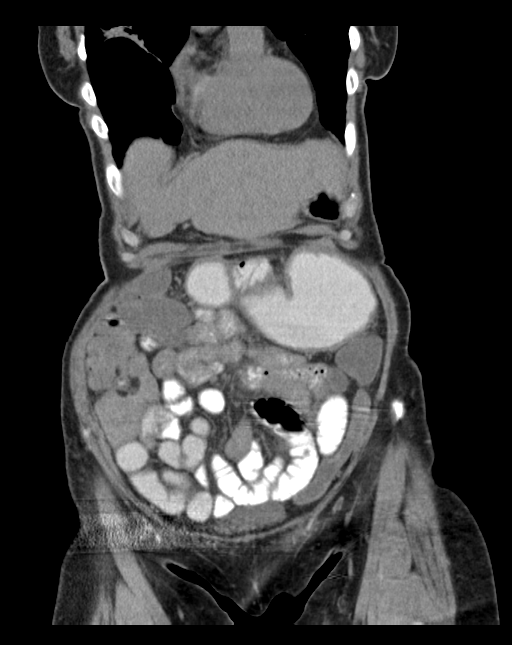
[im 64/143  soft-tissue]
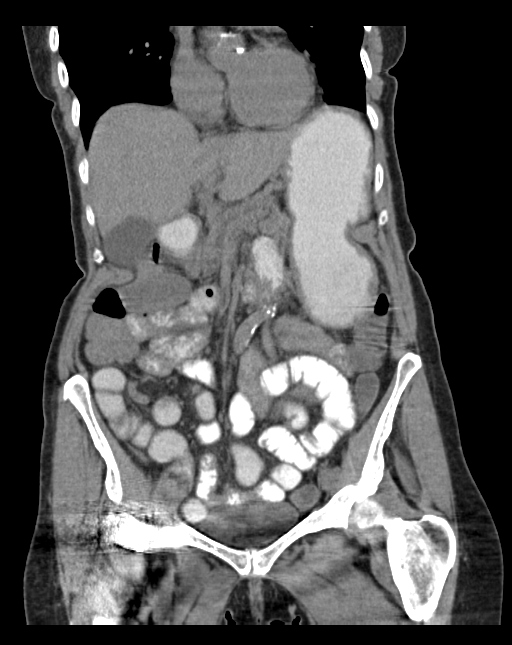
[im 79/143  soft-tissue]
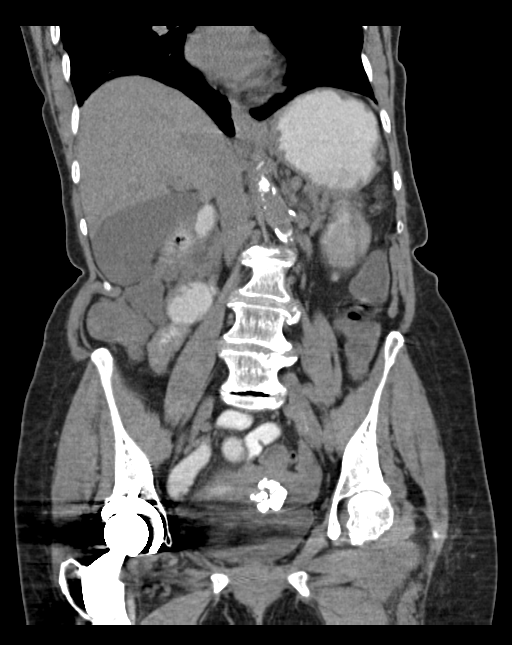

[15 of 46 positions shown; findings below may reference images not displayed]

FINDINGS: Lower chest: There is an area of consolidation within the lower
aspect of the right upper lobe, incompletely visualized. There is
bibasilar dependent atelectasis. There are coronary artery and
cardiac valvular calcifications.

Hepatobiliary: Normal hepatic size and contours. No perihepatic
ascites. No intra- or extrahepatic biliary dilatation. The
gallbladder is dilated without other focal abnormality.

Pancreas: Normal pancreatic contours. No peripancreatic fluid
collection or pancreatic ductal dilatation.

Spleen: Normal.

Adrenals/Urinary Tract: Normal adrenal glands. Intermediate
attenuation lesion of the left renal pelvis is likely a hemorrhagic
cyst. Multiple smaller but similar lesions are seen in the right
kidney. Kidneys are otherwise unremarkable.

Stomach/Bowel: The colon is largely fluid-filled. There is no small
bowel dilatation. No evidence of acute inflammation. Normal
appendix.

Vascular/Lymphatic: There is atherosclerotic calcification of the
non aneurysmal abdominal aorta. No abdominal or pelvic adenopathy.

Reproductive: Calcified fibroid at the uterine fundus. No adnexal
mass.

Musculoskeletal: Status post right total hip arthroplasty.
Multilevel lumbar osteophytosis and facet arthrosis. No bony spinal
canal narrowing. No lytic or blastic lesions. Normal visualized
extrathoracic and extraperitoneal soft tissues.

Other: No contributory non-categorized findings.
IMPRESSION: 1. Fluid-filled colon and small bowel is consistent with acute
diarrheal illness. No evidence of active inflammation or
obstruction.
2. Incompletely visualized area of consolidation within the lower
aspect of the right upper lobe. This may indicate subsegmental
atelectasis, an area of infection or neoplastic process. At a
minimum, follow-up scan of the chest in 4-6 weeks is recommended to
ensure resolution and to confirm that there is no associated mass.
3. Aortic and coronary artery calcific atherosclerosis.
4. Dilated gallbladder without other evidence of acute
cholecystitis.
5. Intermediate attenuation lesion of the left renal pelvis is
statistically most likely to be a hemorrhagic cyst, but is
incompletely evaluated on this study. Correlation with abdominal MRI
with and without contrast or renal mass protocol CT is recommended
on a outpatient basis. Multiple smaller lesions of the same
densities are also seen within the right kidney.

## 2018-06-28 DIAGNOSIS — E1165 Type 2 diabetes mellitus with hyperglycemia: Secondary | ICD-10-CM | POA: Diagnosis not present

## 2018-06-28 DIAGNOSIS — I10 Essential (primary) hypertension: Secondary | ICD-10-CM | POA: Diagnosis not present

## 2018-06-28 DIAGNOSIS — E785 Hyperlipidemia, unspecified: Secondary | ICD-10-CM | POA: Diagnosis not present

## 2018-06-28 DIAGNOSIS — E1151 Type 2 diabetes mellitus with diabetic peripheral angiopathy without gangrene: Secondary | ICD-10-CM | POA: Diagnosis not present

## 2018-07-02 DIAGNOSIS — E1151 Type 2 diabetes mellitus with diabetic peripheral angiopathy without gangrene: Secondary | ICD-10-CM | POA: Diagnosis not present

## 2018-07-02 DIAGNOSIS — E785 Hyperlipidemia, unspecified: Secondary | ICD-10-CM | POA: Diagnosis not present

## 2018-07-02 DIAGNOSIS — I10 Essential (primary) hypertension: Secondary | ICD-10-CM | POA: Diagnosis not present

## 2018-07-02 DIAGNOSIS — Z0000 Encounter for general adult medical examination without abnormal findings: Secondary | ICD-10-CM | POA: Diagnosis not present

## 2018-07-02 DIAGNOSIS — M5136 Other intervertebral disc degeneration, lumbar region: Secondary | ICD-10-CM | POA: Diagnosis not present

## 2018-09-06 DIAGNOSIS — Z23 Encounter for immunization: Secondary | ICD-10-CM | POA: Diagnosis not present

## 2018-10-05 DIAGNOSIS — I10 Essential (primary) hypertension: Secondary | ICD-10-CM | POA: Diagnosis not present

## 2018-10-24 DIAGNOSIS — B0052 Herpesviral keratitis: Secondary | ICD-10-CM | POA: Diagnosis not present

## 2018-10-24 DIAGNOSIS — Z961 Presence of intraocular lens: Secondary | ICD-10-CM | POA: Diagnosis not present

## 2018-10-24 DIAGNOSIS — H40013 Open angle with borderline findings, low risk, bilateral: Secondary | ICD-10-CM | POA: Diagnosis not present

## 2018-10-24 DIAGNOSIS — E119 Type 2 diabetes mellitus without complications: Secondary | ICD-10-CM | POA: Diagnosis not present

## 2019-12-18 ENCOUNTER — Ambulatory Visit: Payer: Medicare Other | Attending: Internal Medicine

## 2019-12-18 DIAGNOSIS — Z23 Encounter for immunization: Secondary | ICD-10-CM | POA: Insufficient documentation

## 2019-12-18 NOTE — Progress Notes (Signed)
   Covid-19 Vaccination Clinic  Name:  Nailah Ocejo    MRN: [id number] DOB: 05-27-1929  12/18/2019  Ms. Ruddy was observed post Covid-19 immunization for 15 minutes without incidence. She was provided with Vaccine Information Sheet and instruction to access the V-Safe system.   Ms. Spoonemore was instructed to call 911 with any severe reactions post vaccine: Marland Kitchen Difficulty breathing  . Swelling of your face and throat  . A fast heartbeat  . A bad rash all over your body  . Dizziness and weakness    Immunizations Administered    Name Date Dose VIS Date Route   Pfizer COVID-19 Vaccine 12/18/2019  8:34 AM 0.3 mL 11/15/2019 Intramuscular   Manufacturer: [place]   Lot: [id number]   [place]: [id number]

## 2020-01-08 ENCOUNTER — Ambulatory Visit: Payer: Self-pay

## 2020-01-08 ENCOUNTER — Ambulatory Visit: Payer: Medicare Other | Attending: Internal Medicine

## 2020-01-08 DIAGNOSIS — Z23 Encounter for immunization: Secondary | ICD-10-CM | POA: Insufficient documentation

## 2020-01-08 NOTE — Progress Notes (Signed)
   Covid-19 Vaccination Clinic  Name:  Brandy Hawkins    MRN: [id number] DOB: 02-05-1929  01/08/2020  [name] was observed post Covid-19 immunization for 15 minutes without incidence. She was provided with Vaccine Information Sheet and instruction to access the V-Safe system.   [name] was instructed to call 911 with any severe reactions post vaccine: Marland Kitchen Difficulty breathing  . Swelling of your face and throat  . A fast heartbeat  . A bad rash all over your body  . Dizziness and weakness    Immunizations Administered    Name Date Dose VIS Date Route   Pfizer COVID-19 Vaccine 01/08/2020  8:10 AM 0.3 mL 11/15/2019 Intramuscular   Manufacturer: [place]   Lot: [id number]   [place]: [id number]

## 2020-08-05 DEATH — deceased
